# Patient Record
Sex: Female | Born: 1945 | Race: White | Hispanic: No | Marital: Married | Smoking: Never smoker
Health system: Southern US, Academic
[De-identification: ages and names within clinical notes are randomized; demographics above are authoritative.]

## PROBLEM LIST (undated history)

## (undated) DIAGNOSIS — M797 Fibromyalgia: Secondary | ICD-10-CM

## (undated) DIAGNOSIS — T148XXA Other injury of unspecified body region, initial encounter: Secondary | ICD-10-CM

## (undated) DIAGNOSIS — C801 Malignant (primary) neoplasm, unspecified: Secondary | ICD-10-CM

## (undated) DIAGNOSIS — E119 Type 2 diabetes mellitus without complications: Secondary | ICD-10-CM

## (undated) DIAGNOSIS — G4730 Sleep apnea, unspecified: Secondary | ICD-10-CM

## (undated) DIAGNOSIS — Z853 Personal history of malignant neoplasm of breast: Secondary | ICD-10-CM

## (undated) DIAGNOSIS — I10 Essential (primary) hypertension: Secondary | ICD-10-CM

## (undated) DIAGNOSIS — M1990 Unspecified osteoarthritis, unspecified site: Secondary | ICD-10-CM

## (undated) DIAGNOSIS — K219 Gastro-esophageal reflux disease without esophagitis: Secondary | ICD-10-CM

## (undated) DIAGNOSIS — C55 Malignant neoplasm of uterus, part unspecified: Secondary | ICD-10-CM

## (undated) DIAGNOSIS — H409 Unspecified glaucoma: Secondary | ICD-10-CM

## (undated) HISTORY — PX: COLONOSCOPY: SHX174

## (undated) HISTORY — PX: LYMPH NODE BIOPSY: SHX201

## (undated) HISTORY — DX: Malignant (primary) neoplasm, unspecified: C80.1

## (undated) HISTORY — PX: UTERINE FIBROID SURGERY: SHX826

## (undated) HISTORY — PX: HX BREAST LUMPECTOMY: SHX2

## (undated) HISTORY — DX: Malignant neoplasm of uterus, part unspecified: C55

## (undated) HISTORY — DX: Other injury of unspecified body region, initial encounter: T14.8XXA

## (undated) HISTORY — DX: Personal history of malignant neoplasm of breast: Z85.3

## (undated) HISTORY — PX: HX GALL BLADDER SURGERY/CHOLE: SHX55

## (undated) HISTORY — PX: EYE SURGERY: SHX253

## (undated) HISTORY — DX: Unspecified glaucoma: H40.9

## (undated) HISTORY — PX: HX HEART CATHETERIZATION: SHX148

## (undated) HISTORY — PX: NOSE SURGERY: SHX723

## (undated) HISTORY — DX: Sleep apnea, unspecified: G47.30

## (undated) HISTORY — PX: HX HYSTERECTOMY: SHX81

## (undated) HISTORY — PX: BILATERAL OOPHORECTOMY: SHX1221

## (undated) HISTORY — DX: Essential (primary) hypertension: I10

## (undated) HISTORY — PX: NERVE SURGERY: SHX1016

---

## 1992-10-07 ENCOUNTER — Other Ambulatory Visit (HOSPITAL_COMMUNITY): Payer: Self-pay

## 2008-06-16 DIAGNOSIS — R9439 Abnormal result of other cardiovascular function study: Secondary | ICD-10-CM | POA: Insufficient documentation

## 2013-02-17 DIAGNOSIS — L900 Lichen sclerosus et atrophicus: Secondary | ICD-10-CM | POA: Insufficient documentation

## 2013-02-18 DIAGNOSIS — C541 Malignant neoplasm of endometrium: Secondary | ICD-10-CM | POA: Insufficient documentation

## 2015-09-15 IMAGING — MG MAMMO DIGITAL SCREENING
1 series · 5 of 5 positions shown · non-contrast
Comparison: 

------------- REPORT [id number] -------------
MAMMO DIGITAL SCREENING WITH CAD

VOLKMAR GORAN,NP
Exam:  
Screening digital mammogram with CAD
INDICATION: Annual screening.

[R CC · oblique · right · 5 of 5 slices shown]
[im 1/5]
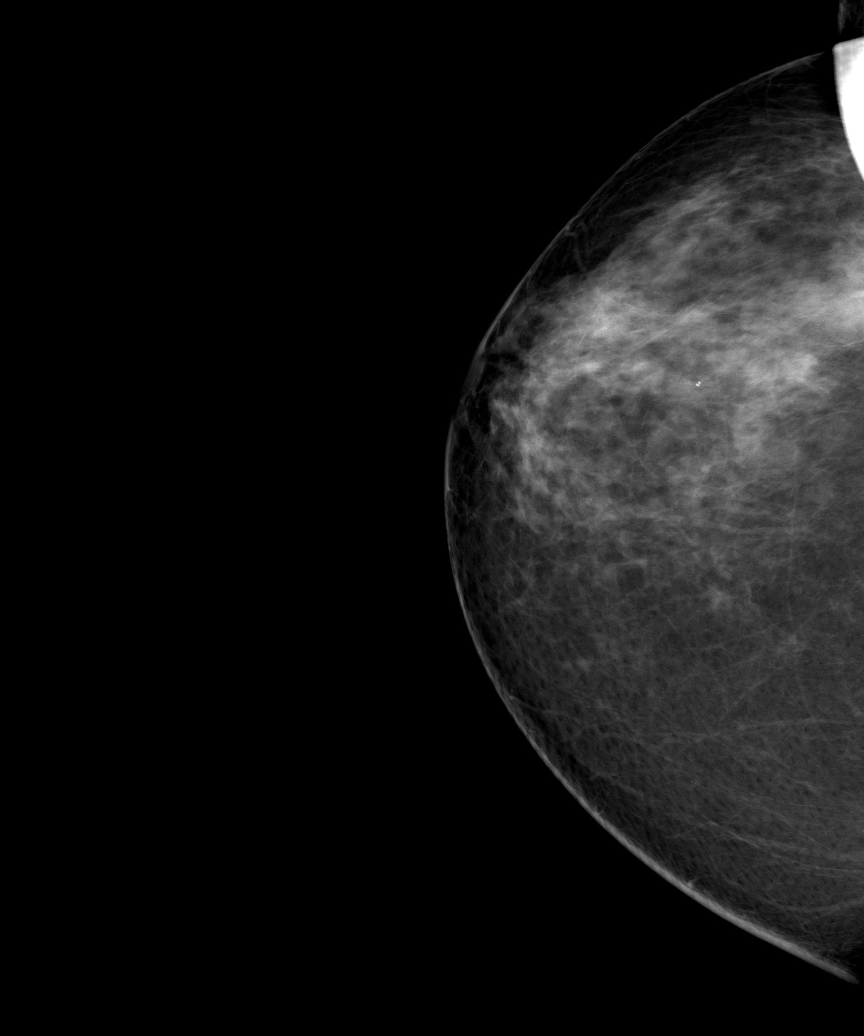
[im 2/5]
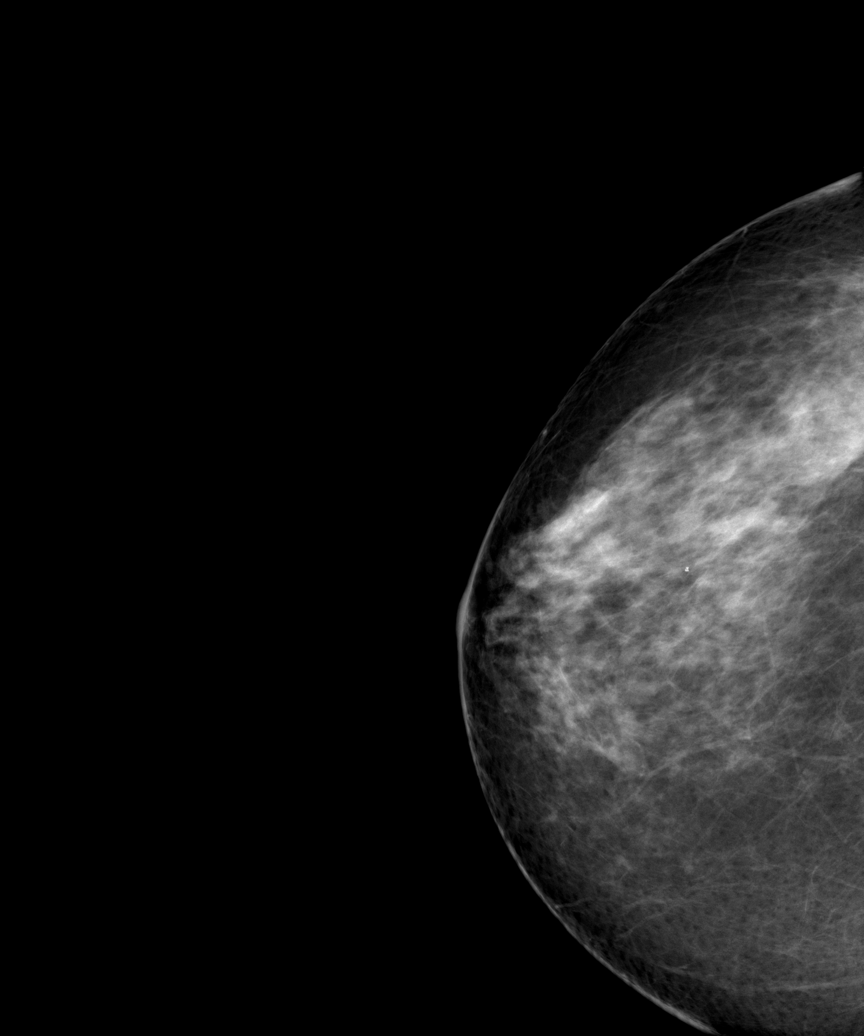
[im 3/5]
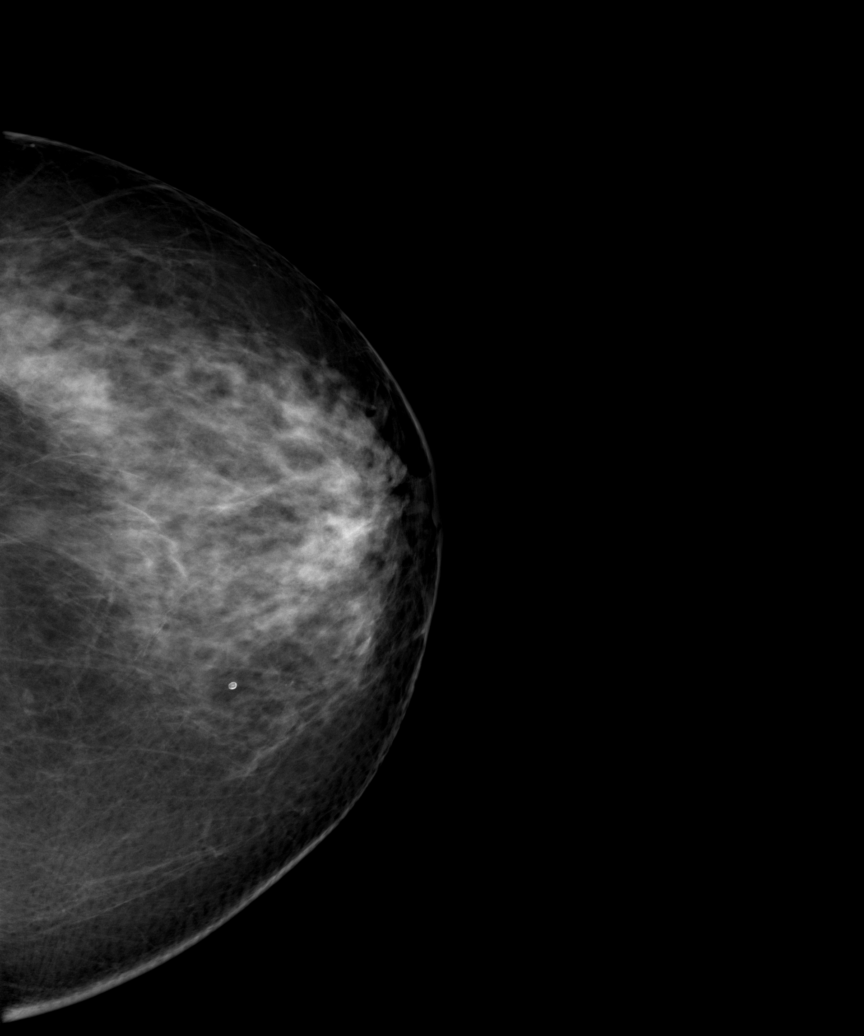
[im 4/5]
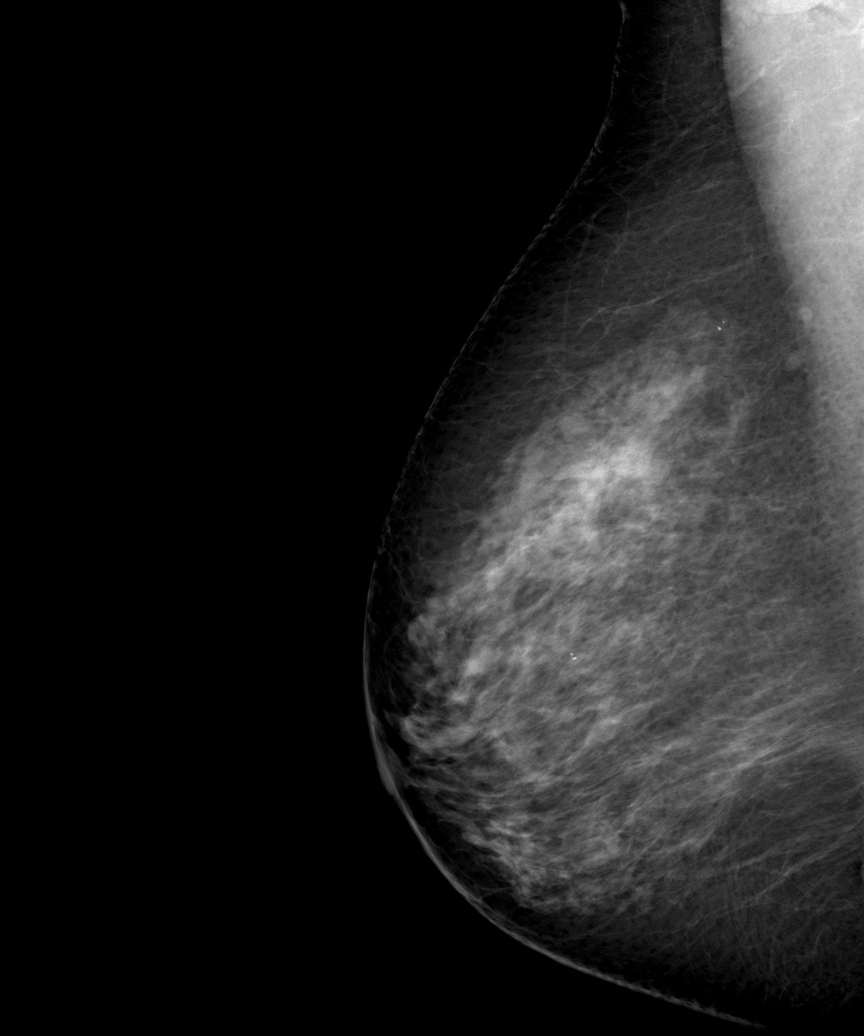
[im 5/5]
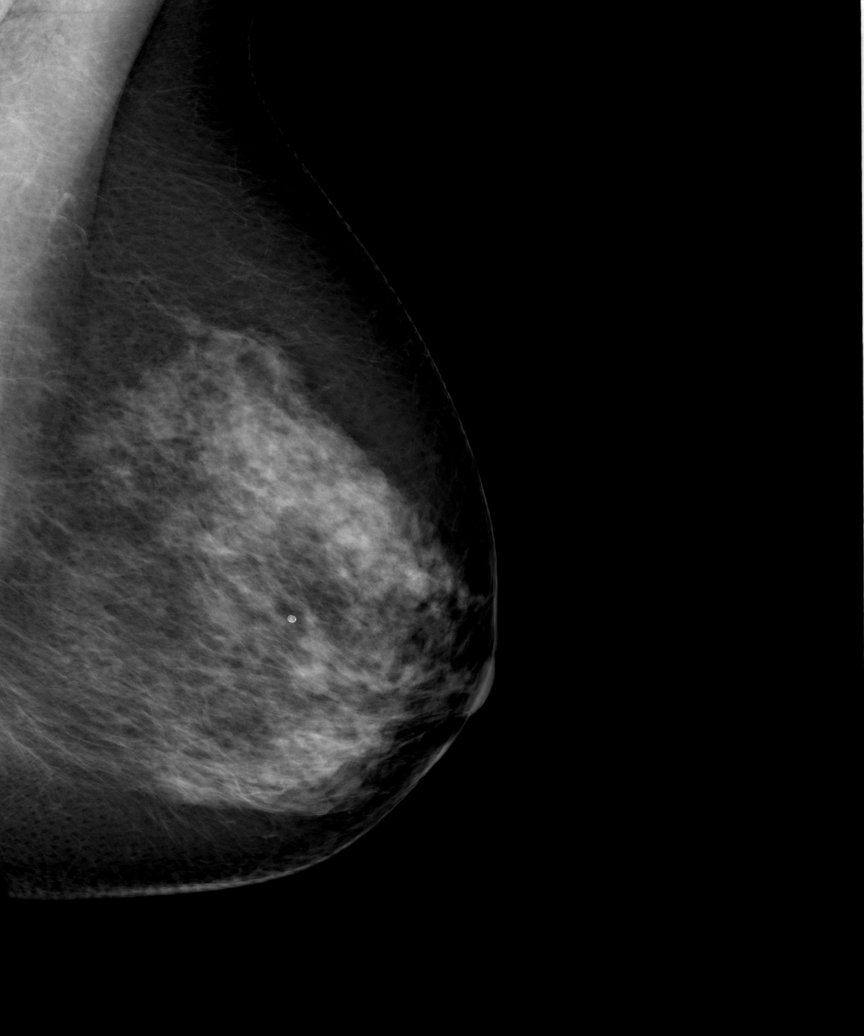

[5 of 5 positions shown; findings below may reference images not displayed]

FINDINGS: Breast parenchyma is heterogeneously dense. There is no mass or suspicious cluster of microcalcifications. There is no architectural distortion, skin thickening or nipple retraction.
IMPRESSION: 1.
BIRADS 2-Benign findings. Patient has been added in a reminder system with a
target date for the next screening mammography.
2.
DENSITY CODE   C (Heterogeneously dense)
Final Assessment Code:
Bi-Rads 2 

BI-RADS 0
Need additional imaging evaluation
BI-RADS 1
Negative mammogram
BI-RADS 2
Benign finding
BI-RADS 3
Probably benign finding: short-interval follow-up suggested
BI-RADS 4
Suspicious abnormality:  biopsy should be considered
BI-RADS 5
Highly suggestive of malignancy; appropriate action should be taken
BI-RADS 6
Known Biopsy-proven Malignancy  Appropriate action should be taken
NOTE:
In compliance with Federal regulations, the results of this mammogram are being sent to the patient.

------------- REPORT [id number] -------------
[facility]
[address]
We wish to report the following on your recent mammography examination. We are sending a report to your referring physician or other health care provider. 
(       Normal/Negative:
No evidence of cancer.
This statement is mandated by the [place], Department of Health.
Your examination was performed by one of our technologists, who are registered radiological technologists and also specially certified in mammography:
___
Frank, Zarina (M)
___
Huidrom, Rutik (M)

Your mammogram was interpreted by our radiologist.

( 
Jean Rousso Aniece, M.D.

(Annual Breast Examination by a physician or other health care provider
(Annual Mammography Screening beginning at age 40
(Monthly Breast Self Examination

## 2016-02-01 IMAGING — US ULTRASOUND BREAST
1 series · 13 of 25 positions shown · non-contrast
Comparison: Screening mammogram dated 09/15/2015.

ULTRASOUND BREAST RIGHT COMPLETE,ULTRASOUND BREAST LEFT COMPLETE 
Exam:  
Complete right breast ultrasound 

Exam:
Complete left breast ultrasound
INDICATION: Bilateral breast pain.

[Series 2: ultrasound breast · oblique · 0.10mm/px · 13 of 34 slices shown]
[im 1/34]
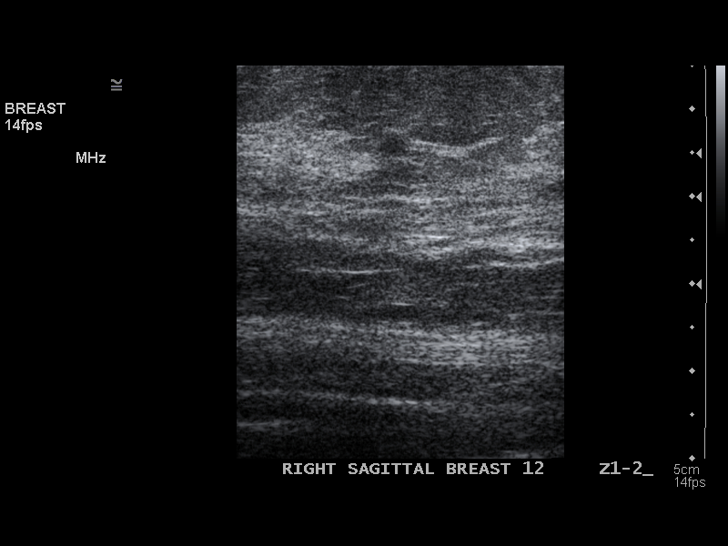
[im 3/34]
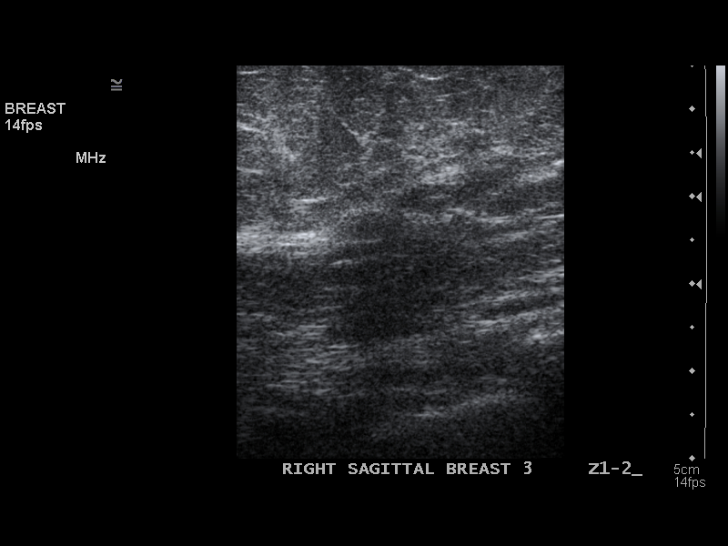
[im 6/34]
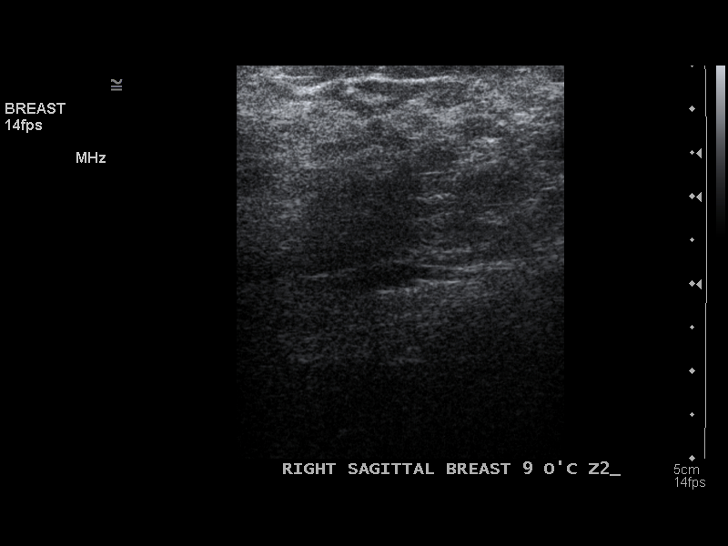
[im 9/34]
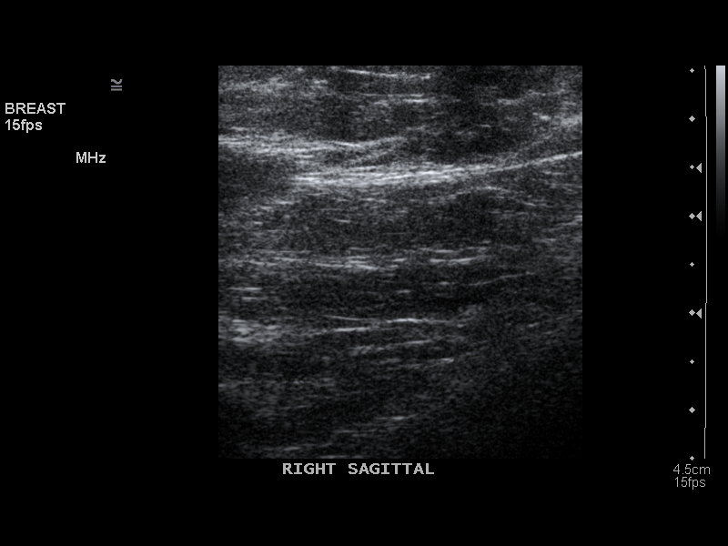
[im 12/34]
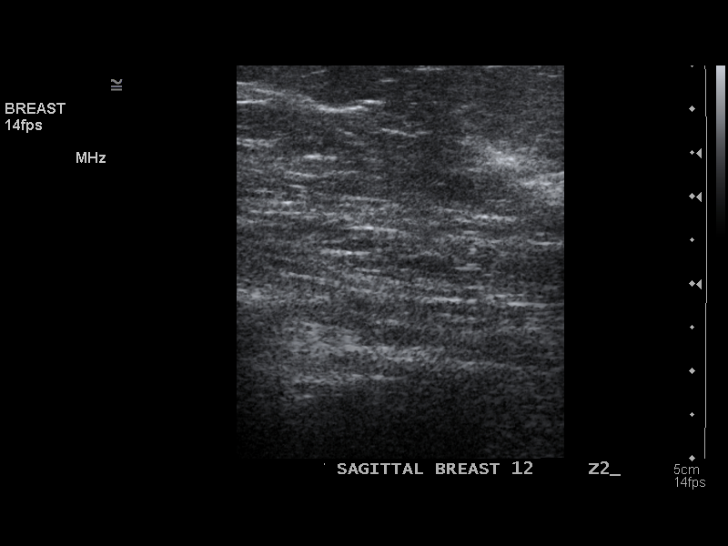
[im 14/34]
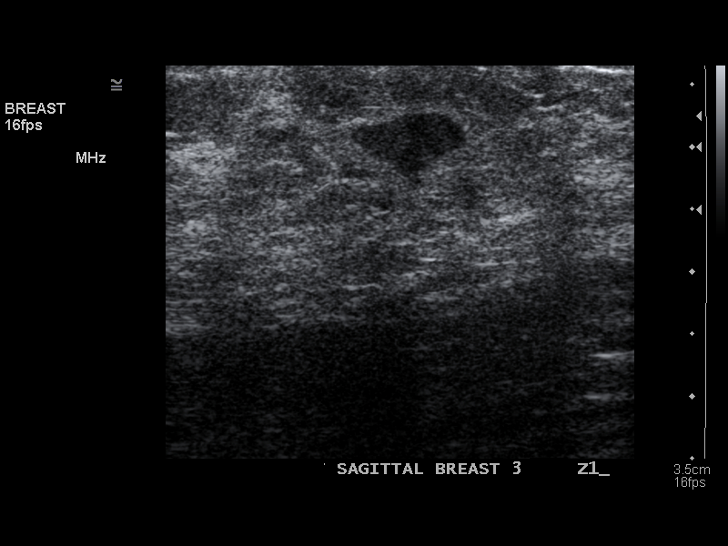
[im 17/34]
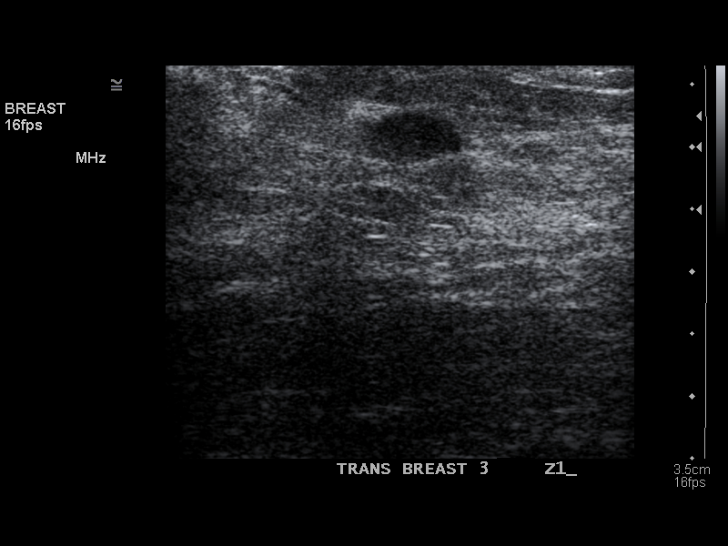
[im 20/34]
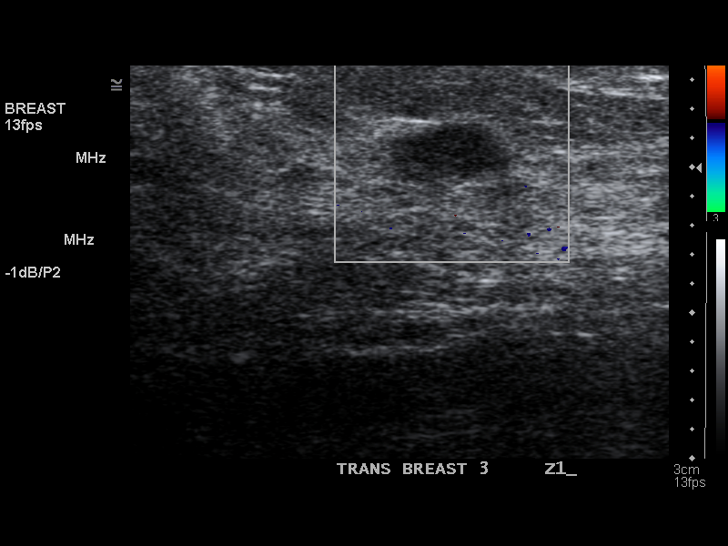
[im 23/34]
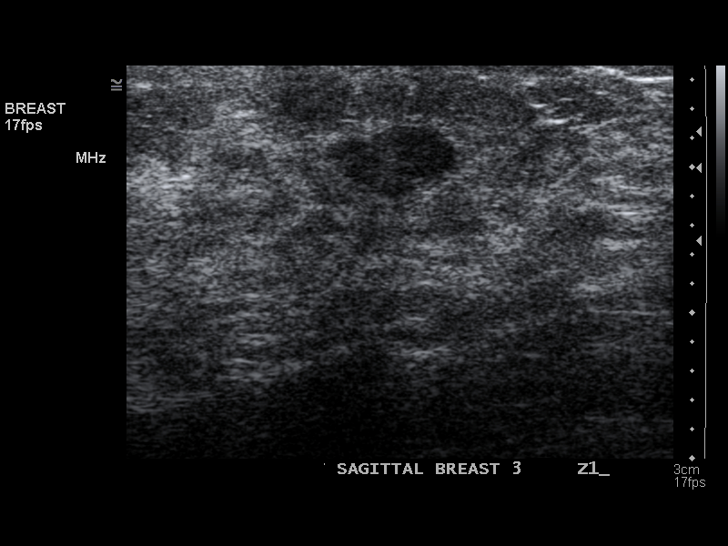
[im 25/34]
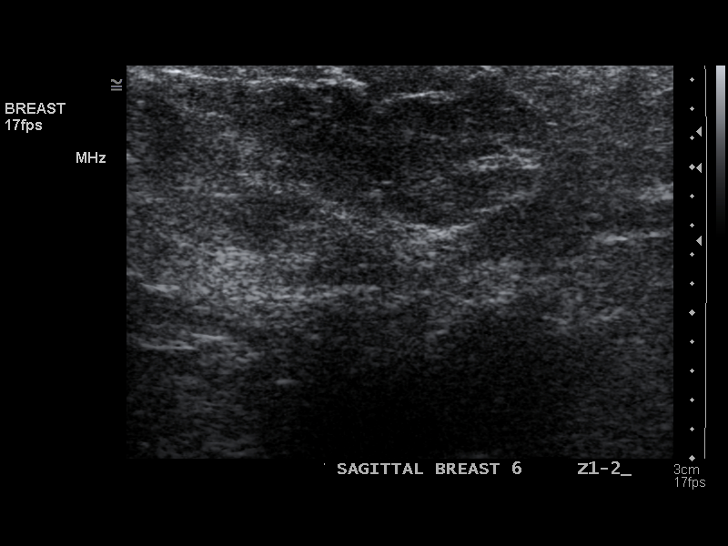
[im 28/34]
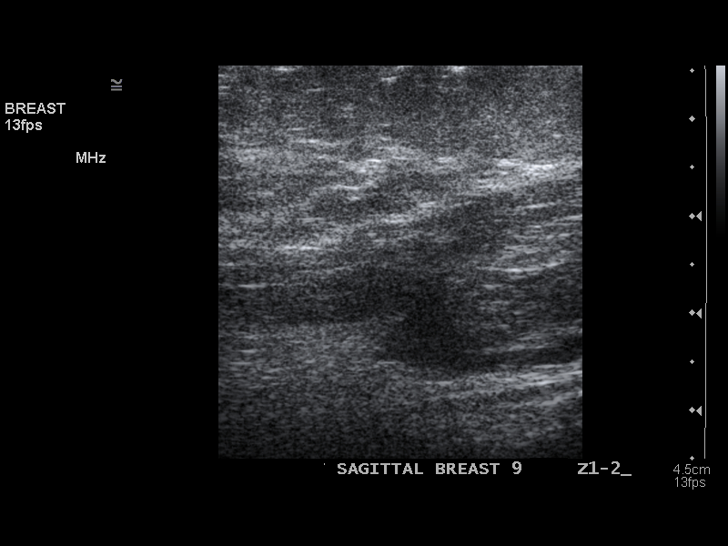
[im 31/34]
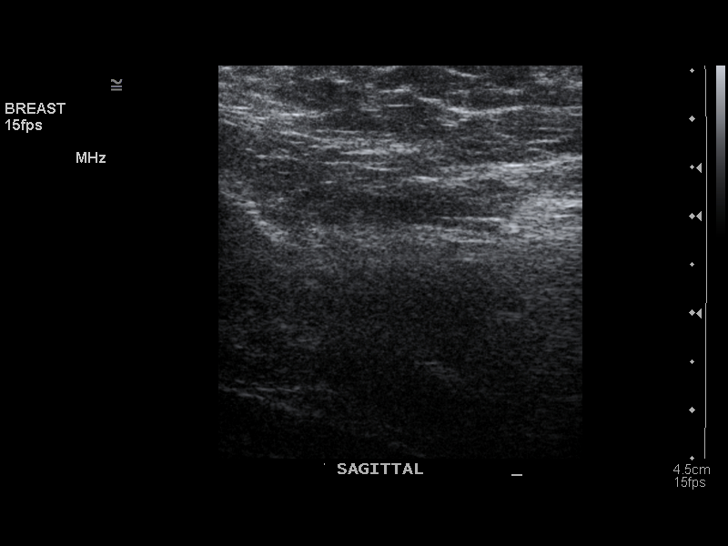
[im 34/34]
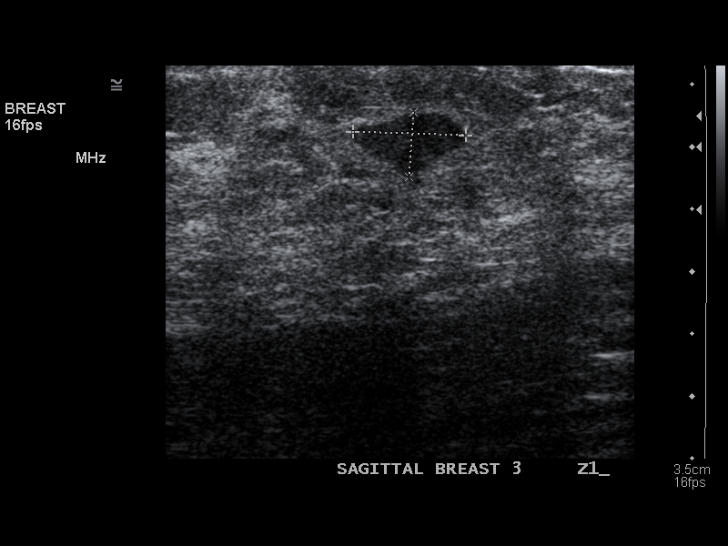

[13 of 25 positions shown; findings below may reference images not displayed]

FINDINGS: A comprehensive sonographic evaluation of both breasts was performed and represented images of all four quadrants, retroareolar and axillary regions were obtained. There is a 9 x 8 mm hypoechoic mass at 3 oclock position within the left breast with slightly indistinct posterior margin and some internal blood flow. No ductal dilatation, architectural distortion, skin thickening or axillary adenopathy is identified on either side.
IMPRESSION: 1.
BIRADS 6O-8ow probability of malignancy. A 9 x 8 mm hypoechoic mass at 3 oclock position within the left breast. Followup imaging guided biopsy is recommended to exclude neoplasm. Findings were discussed with the patient at the time of examination. 

Final Assessment Code:
Bi-Rads 4A

BI-RADS 0
Need additional imaging evaluation
BI-RADS 1
Negative mammogram
BI-RADS 2
Benign finding
BI-RADS 3
Probably benign finding: short-interval follow-up suggested
BI-RADS 4
Suspicious abnormality:  biopsy should be considered
BI-RADS 5
Highly suggestive of malignancy; appropriate action should be taken
BI-RADS 6
Known Biopsy-proven Malignancy  Appropriate action should be taken
NOTE:
In compliance with Federal regulations, the results of this mammogram are being sent to the patient.

## 2016-12-28 IMAGING — CR XRAY RIBS LT W/ PA CHEST
1 series · 4 of 4 positions shown · non-contrast
Comparison: 

Exam:   

Chest 2V
Exam:
Left ribs 3V
INDICATION: Cough.

[Series 2: view not recorded · 0.17mm/px · 4 of 4 slices shown]
[im 1/4]
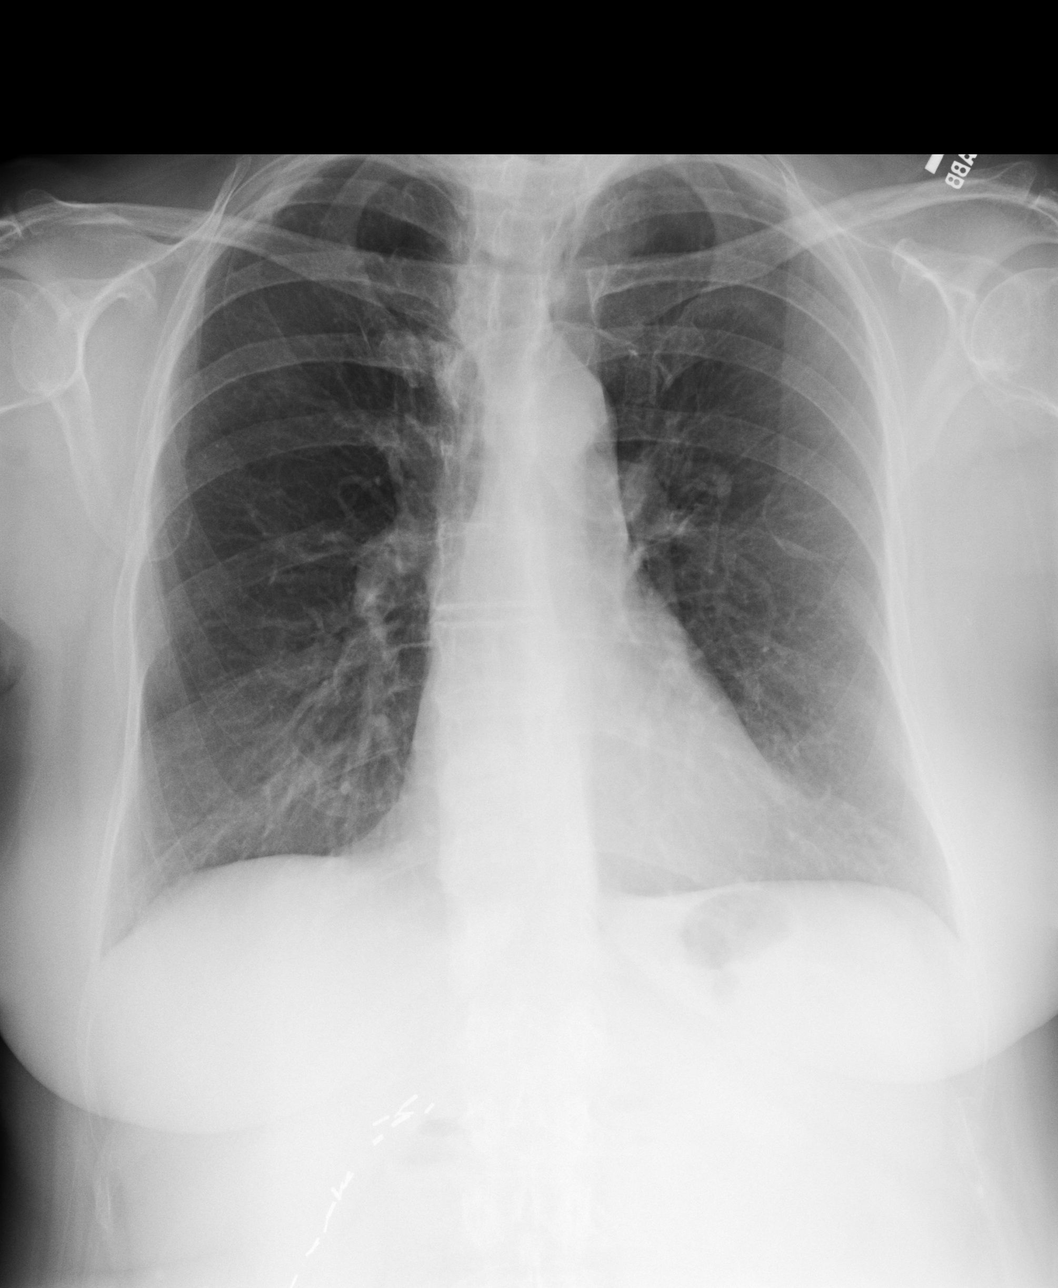
[im 2/4]
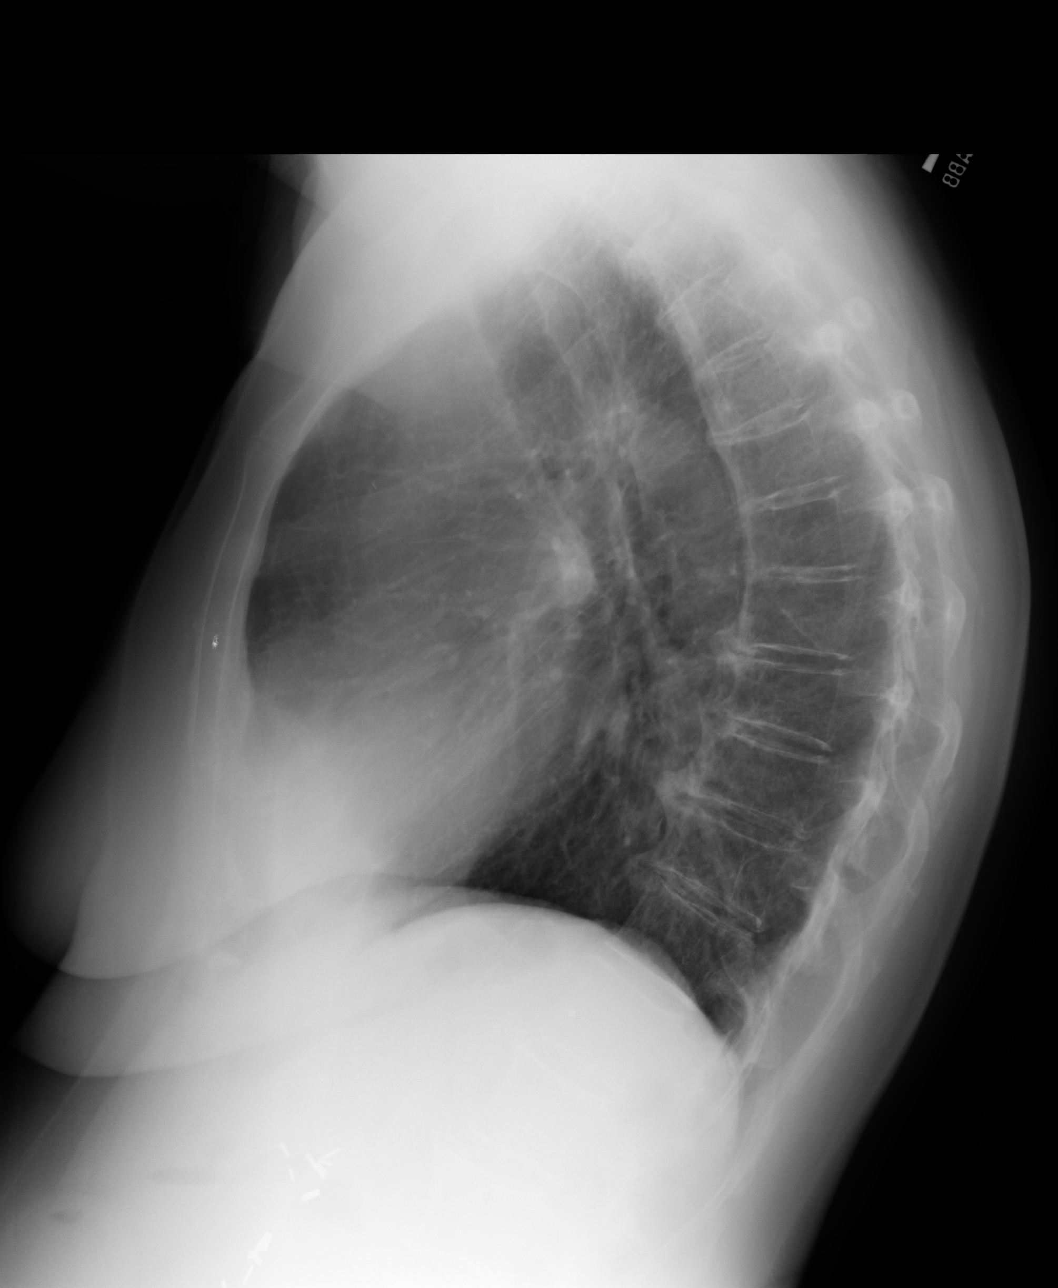
[im 3/4]
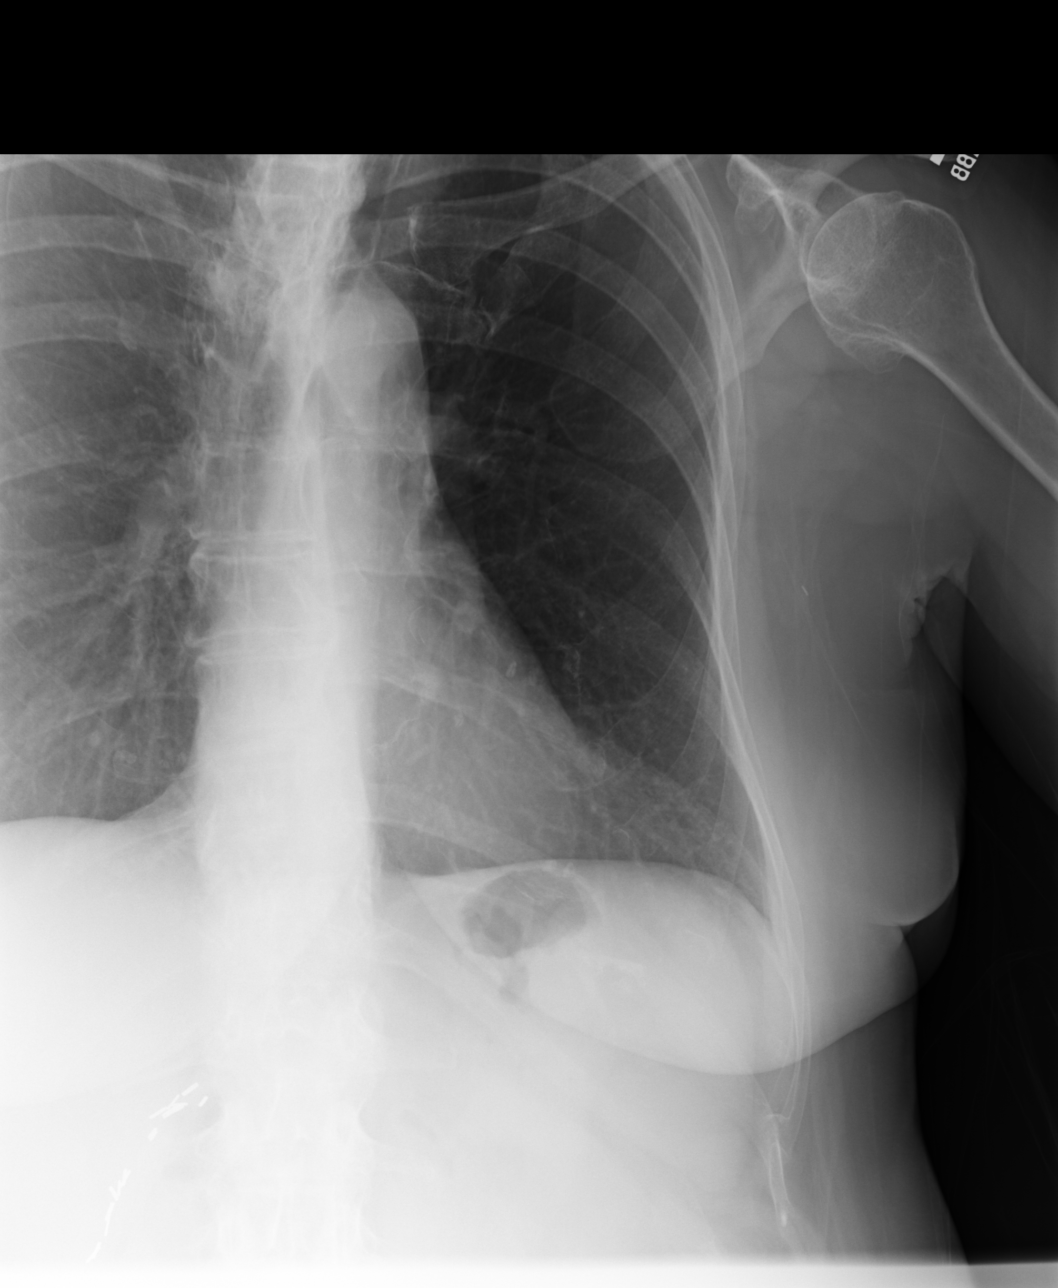
[im 4/4]
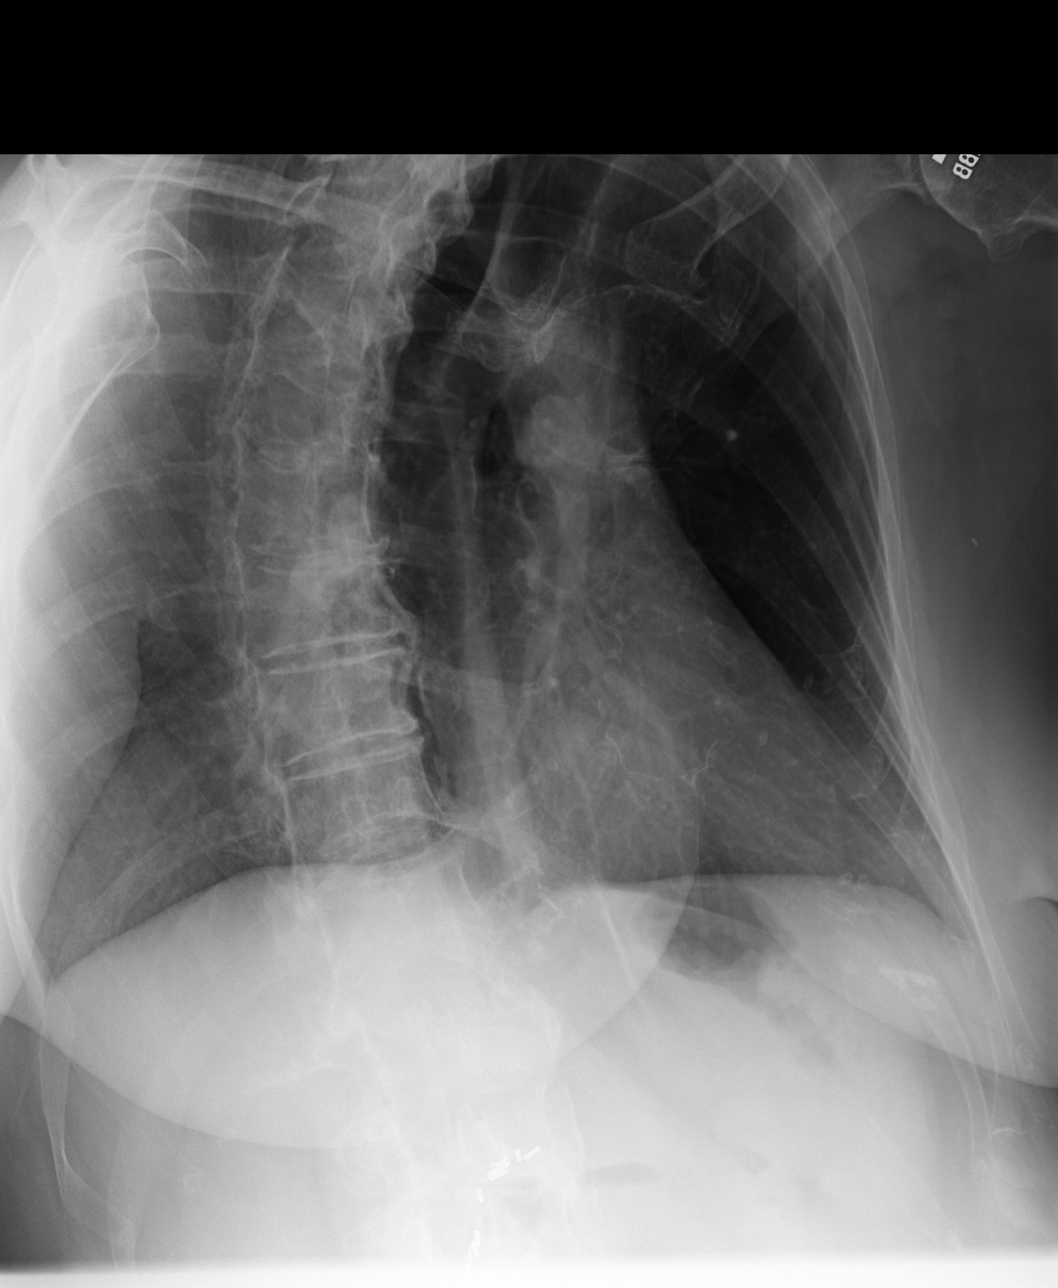

[4 of 4 positions shown; findings below may reference images not displayed]

FINDINGS: There is no consolidation or pleural effusion. There is no pneumothorax. Cardiomediastinal silhouette is normal. No displaced left-sided rib fracture is seen. Visualized upper abdomen demonstrates cholecystectomy clips. Mild arthritic changes are seen within the thoracic spine.
IMPRESSION: No acute abnormality.

## 2020-02-11 IMAGING — CR XRAY KNEE 3 VIEWS LT
1 series · 3 of 3 positions shown · non-contrast
Comparison: None available.

EXAM:  XRAY KNEE 3 VIEWS LT
INDICATION: Left knee pain.

[Series 1: view not recorded · 0.17mm/px · 3 of 3 slices shown]
[im 1/3]
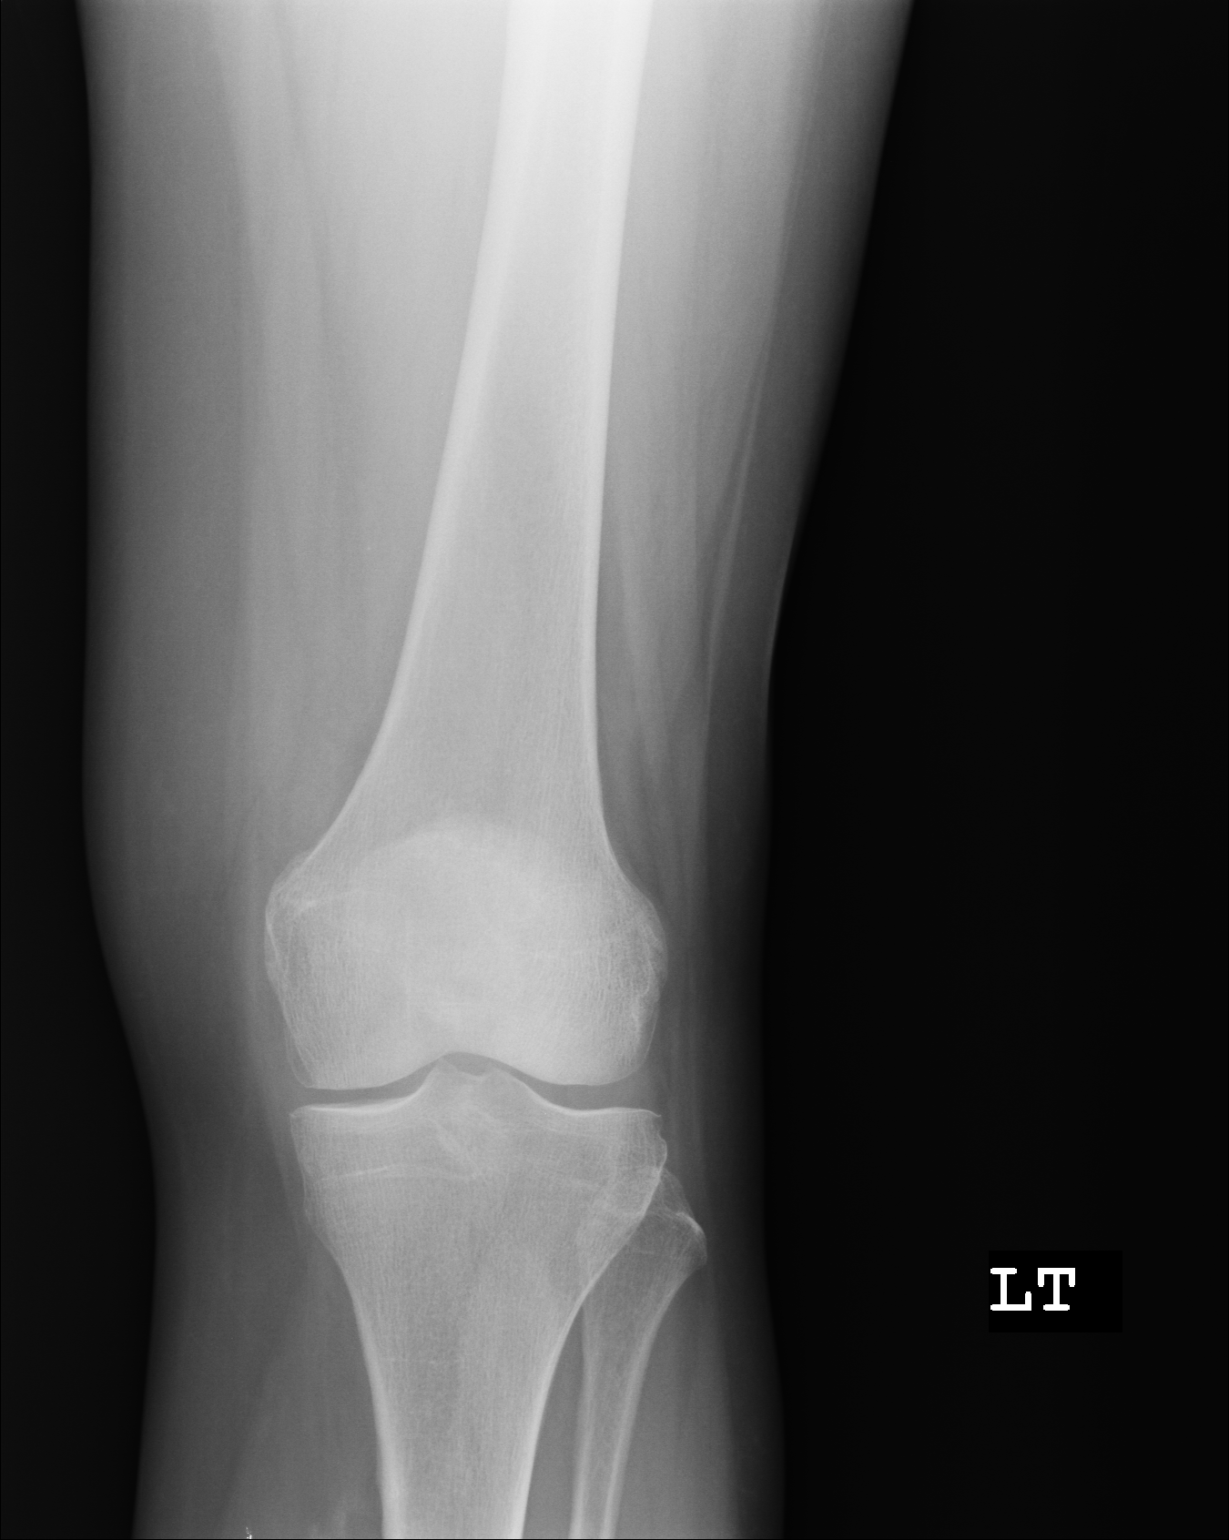
[im 2/3]
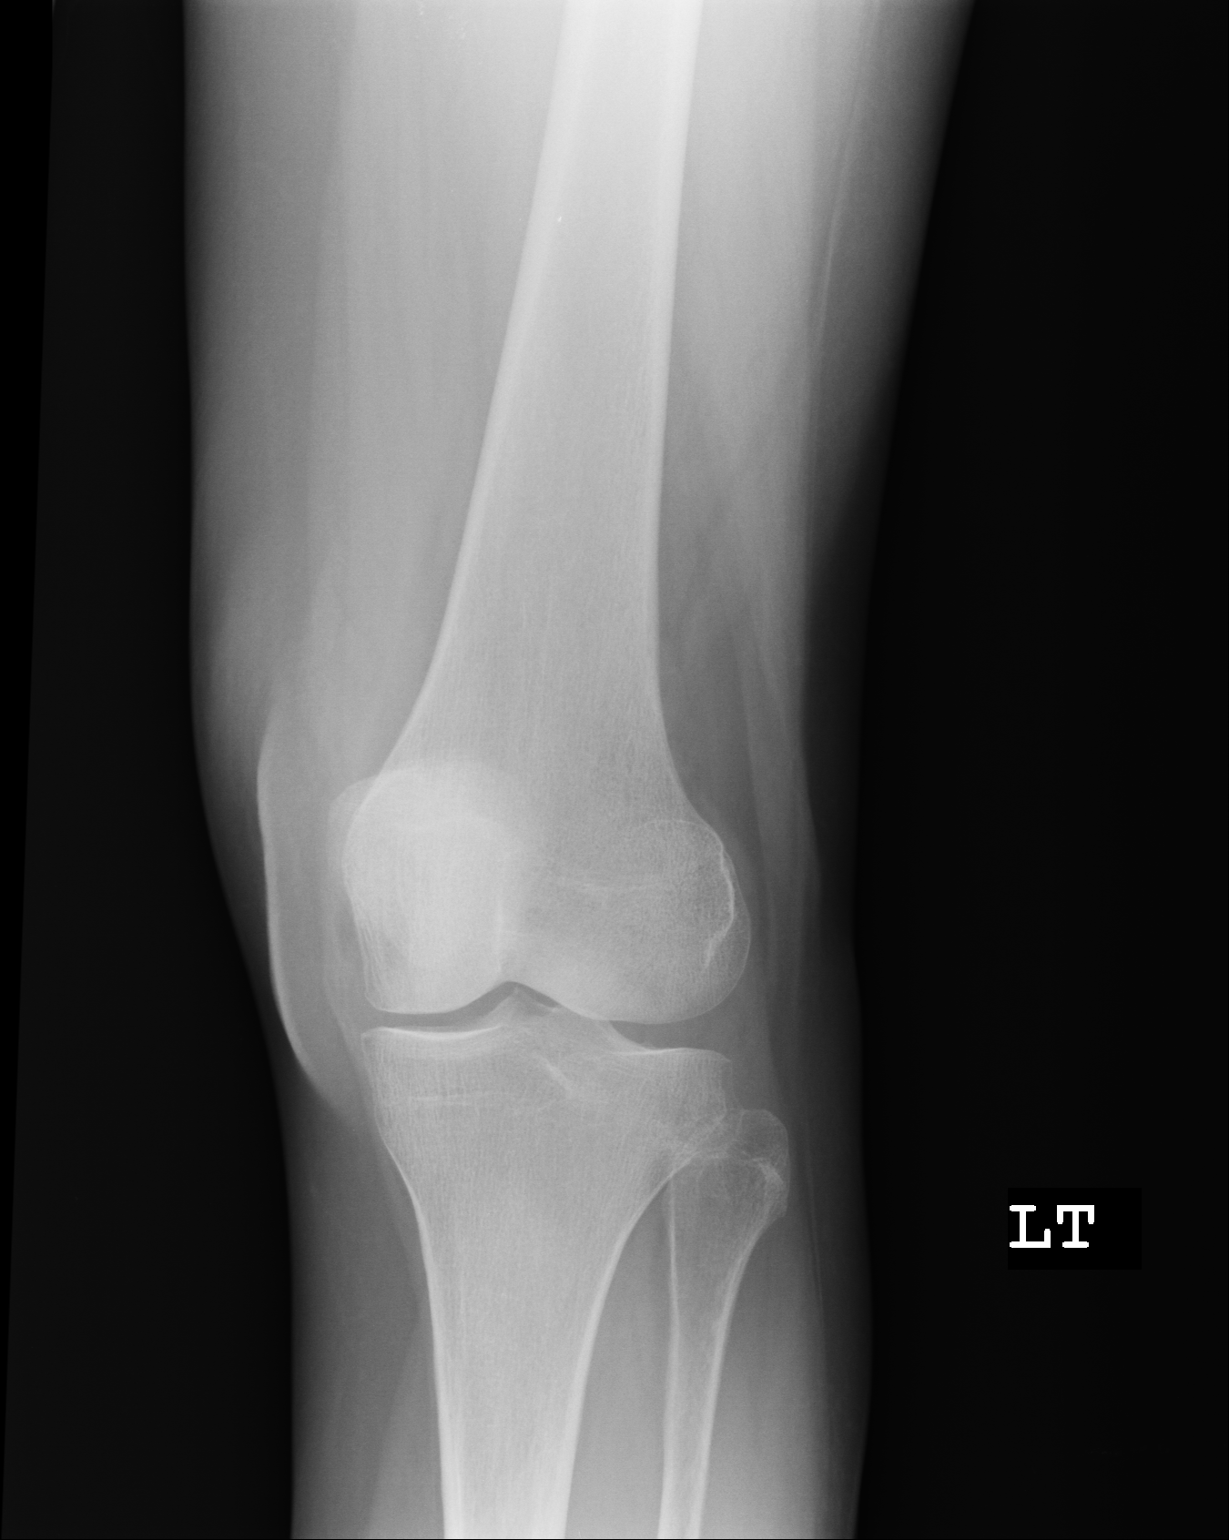
[im 3/3]
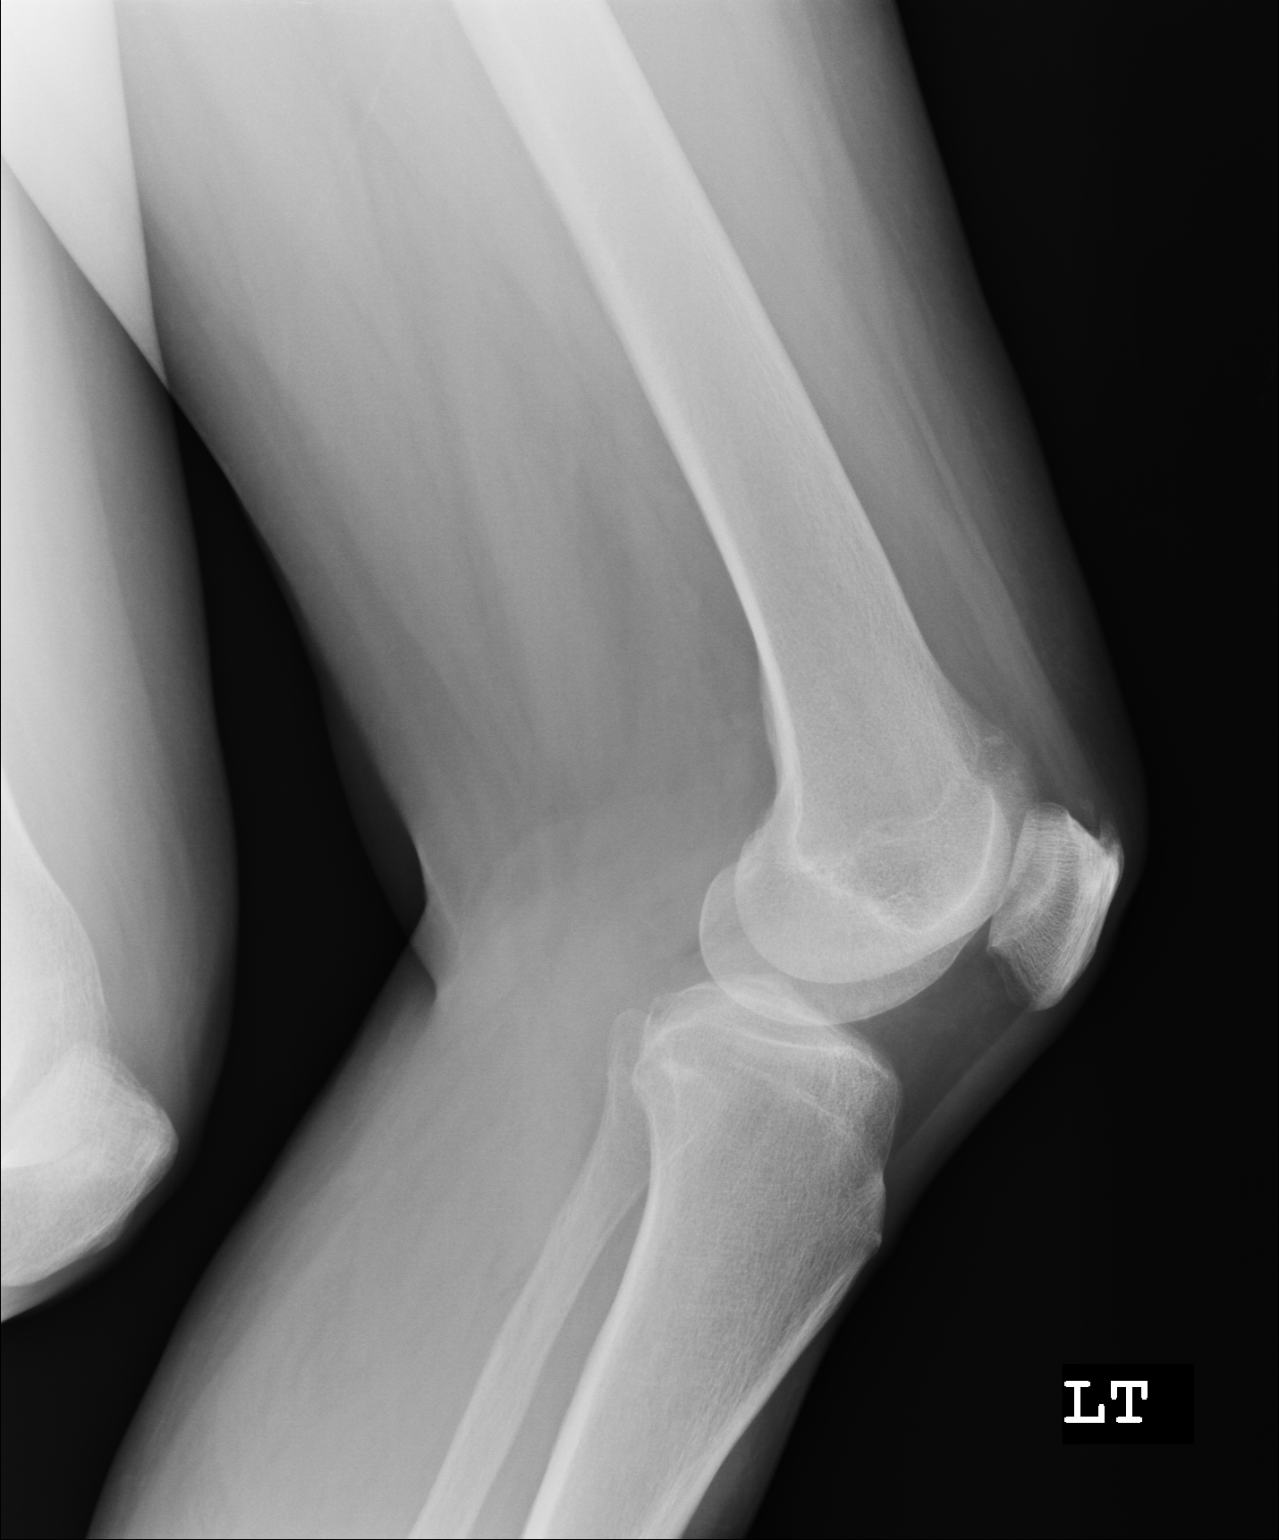

[3 of 3 positions shown; findings below may reference images not displayed]

FINDINGS: There is no acute fracture or subluxation. No significant arthritic changes are seen. There is no suprapatellar effusion. There is no soft tissue abnormality.
IMPRESSION: Unremarkable exam.

## 2020-02-11 IMAGING — CR XRAY HIP W/PELVIS BIL 2 VIEWS
1 series · 2 of 2 positions shown · non-contrast
Comparison: None available.

EXAM:  XRAY HIP W/PELVIS BIL 2 VIEWS
INDICATION: Left hip pain.

[Series 1: view not recorded · 0.17mm/px · 2 of 2 slices shown]
[im 1/2]
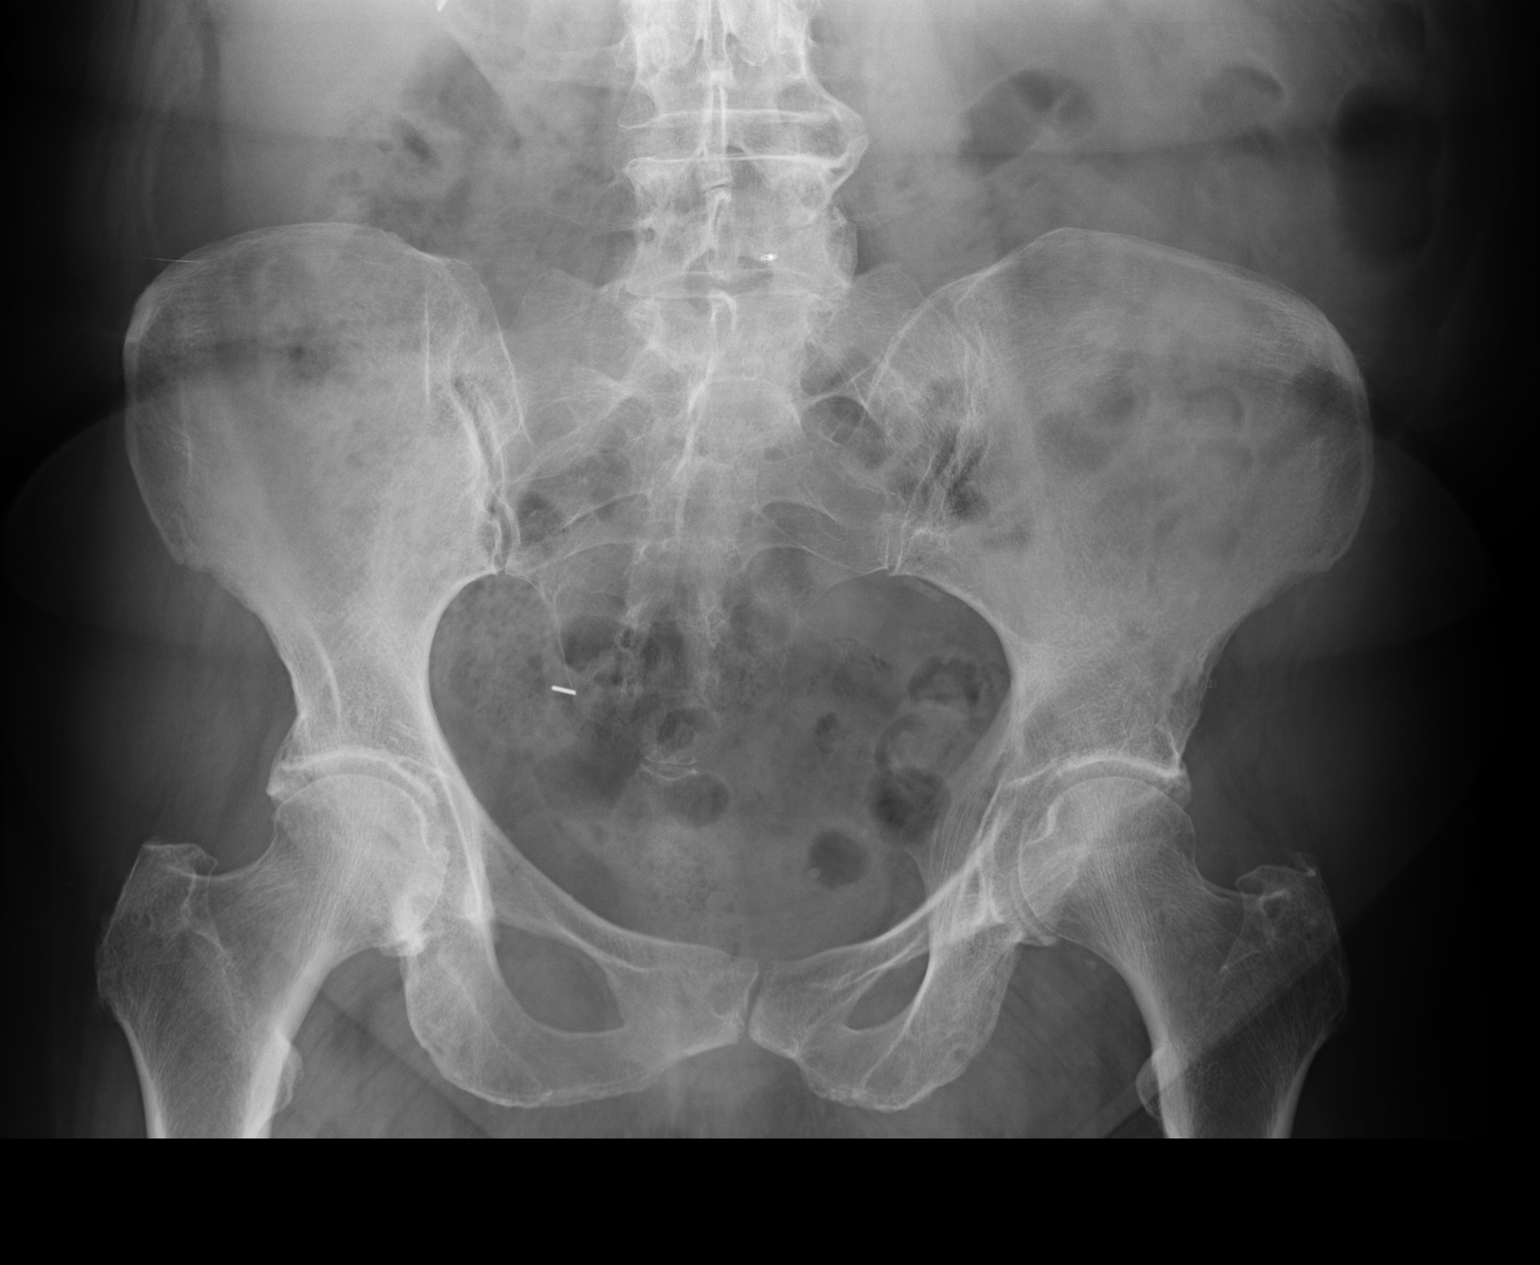
[im 2/2]
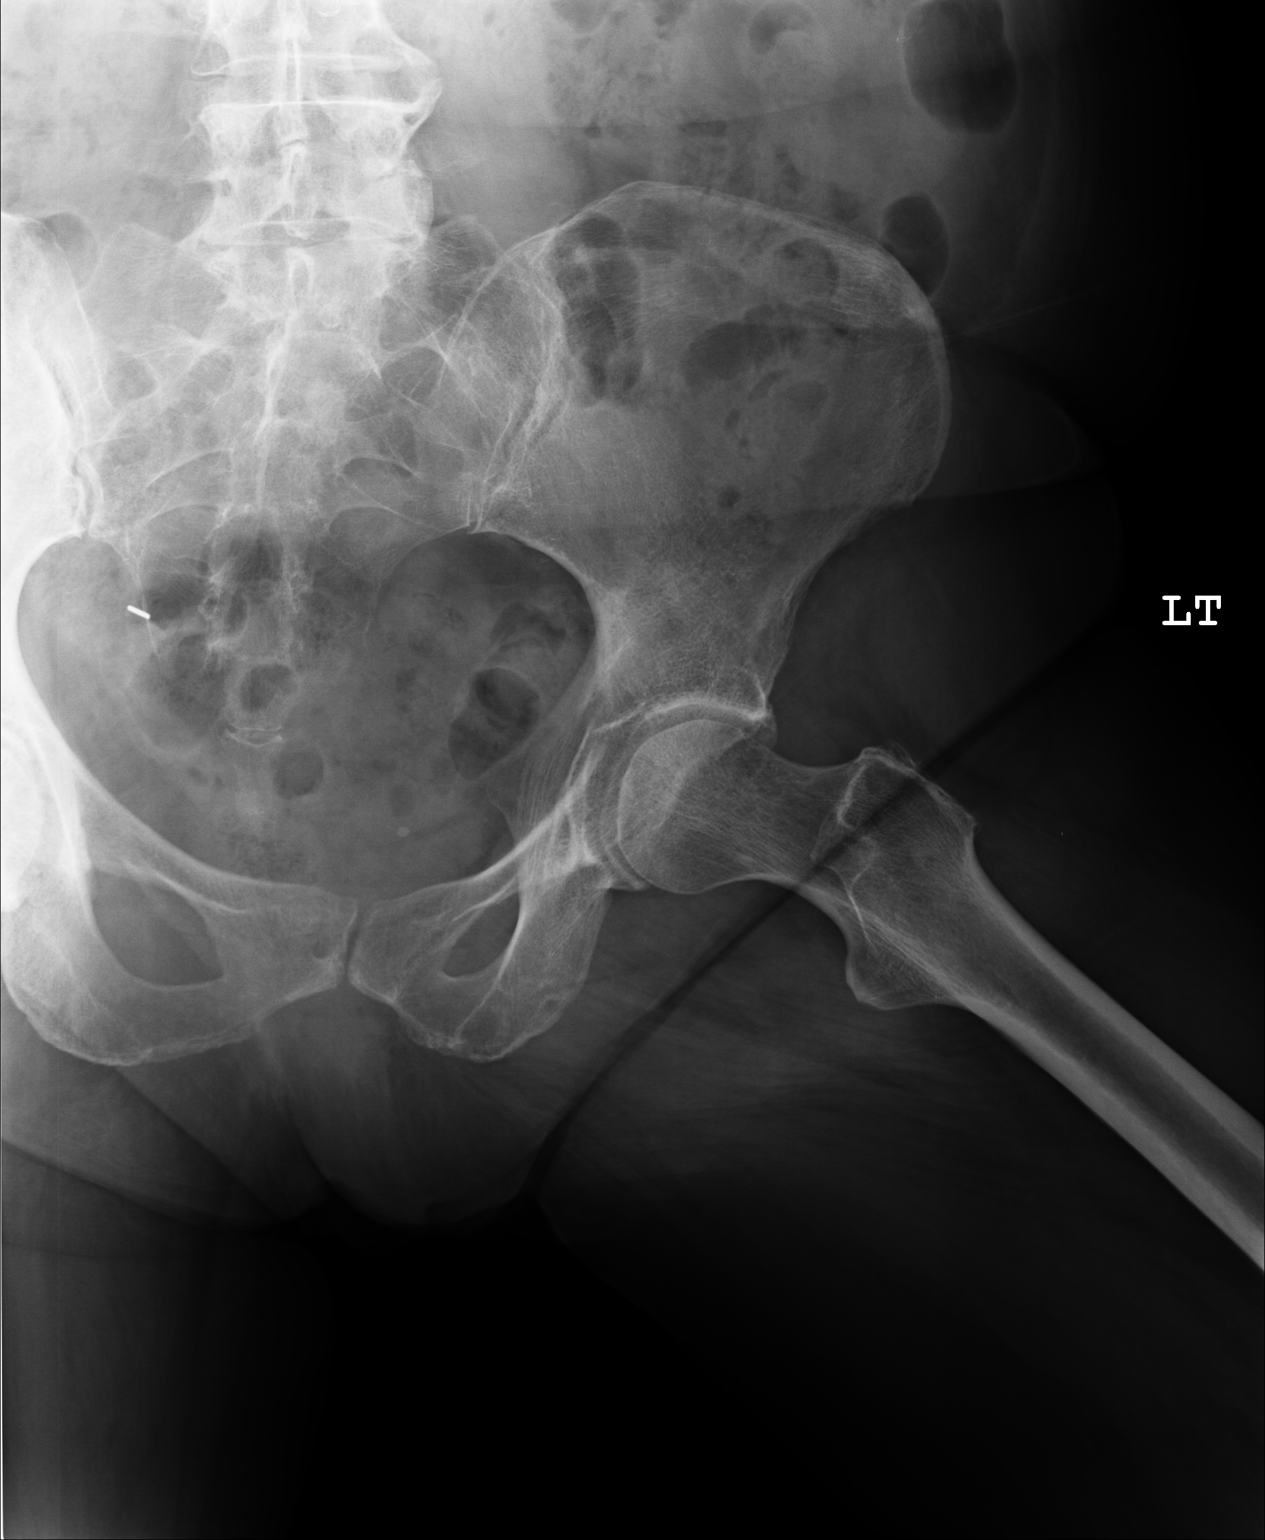

[2 of 2 positions shown; findings below may reference images not displayed]

FINDINGS: There is no acute fracture or subluxation. No significant arthritic changes are seen within the left hip. There is mild right hip osteoarthritis. Surrounding soft tissues are unremarkable.
IMPRESSION: Unremarkable left hip. Mild right hip osteoarthritis.

## 2020-02-11 IMAGING — MR MRI BRAIN WITHOUT AND WITH CONTRAST
10 of 12 series · 35 of 48 positions shown · IV contrast (gadavist)
Comparison: None available.

EXAM:  MRI BRAIN WITHOUT AND WITH CONTRAST
INDICATION: Trigeminal neuralgia.
TECHNIQUE: Multiplanar multisequential MRI of the brain and internal auditory canals was performed without and with 5 mL of Gadavist.

[Series 12: DWI · axial · 5.0mm · 1.35mm/px · z∈[-66,+60]mm · 9 of 88 slices shown (1 of 3)]
[im 1/88]
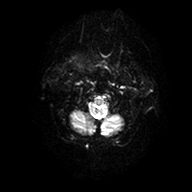
[im 16/88]
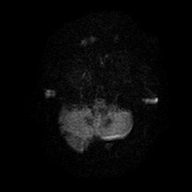
[im 24/88]
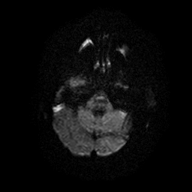
[im 40/88]
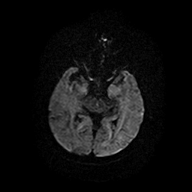
[im 48/88]
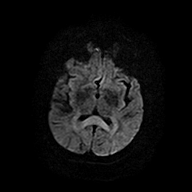
[im 64/88]
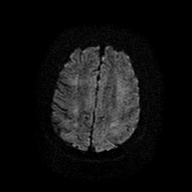
[im 72/88]
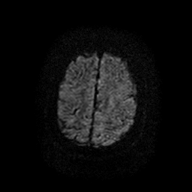
[im 80/88]
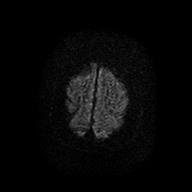
[im 88/88]
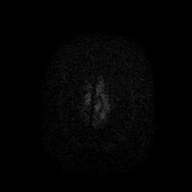

[Series 13: DWI · axial · 5.0mm · 1.35mm/px · z∈[-66,+60]mm · 4 of 22 slices shown (2 of 3)]
[im 1/22]
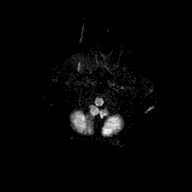
[im 8/22]
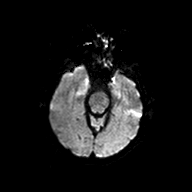
[im 15/22]
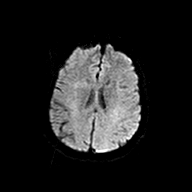
[im 22/22]
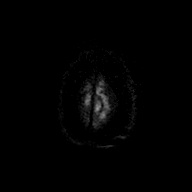

[Series 14: DWI · axial · 5.0mm · 1.35mm/px · z∈[-66,+60]mm · 3 of 22 slices shown (3 of 3)]
[im 1/22]
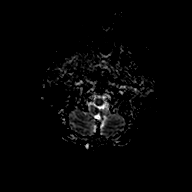
[im 11/22]
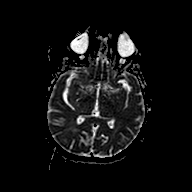
[im 22/22]
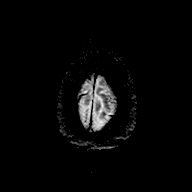

[Series 15: FLAIR · sagittal · 4.0mm · 0.75mm/px · 3 of 26 slices shown (1 of 2)]
[im 1/26]
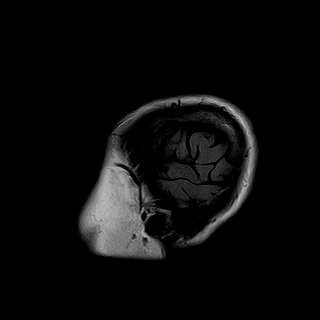
[im 13/26]
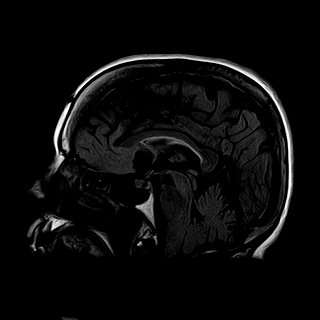
[im 26/26]
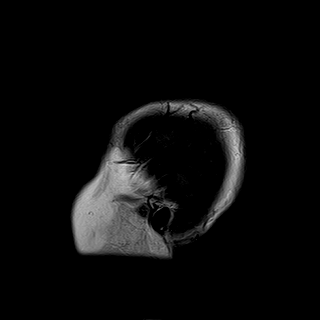

[Series 16: T2 · axial · 4.0mm · 0.43mm/px · z∈[-63,+93]mm · 4 of 36 slices shown]
[im 1/36]
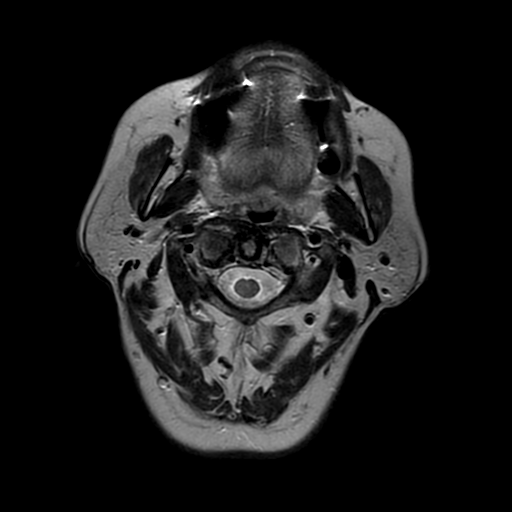
[im 12/36]
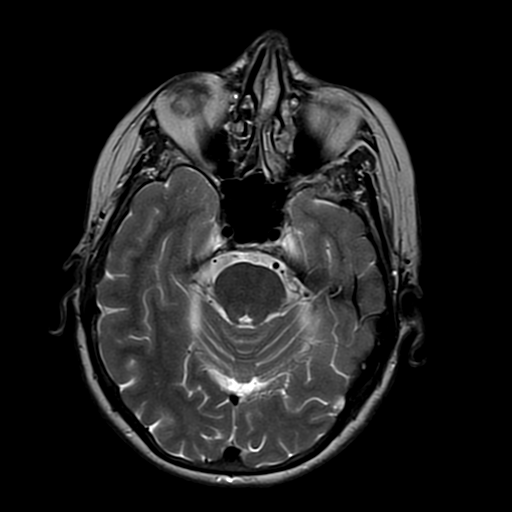
[im 24/36]
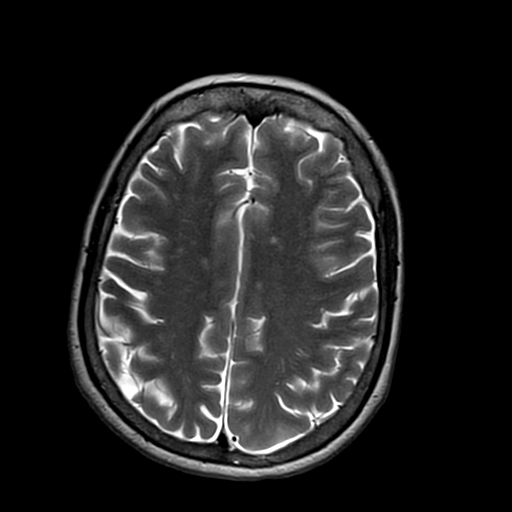
[im 36/36]
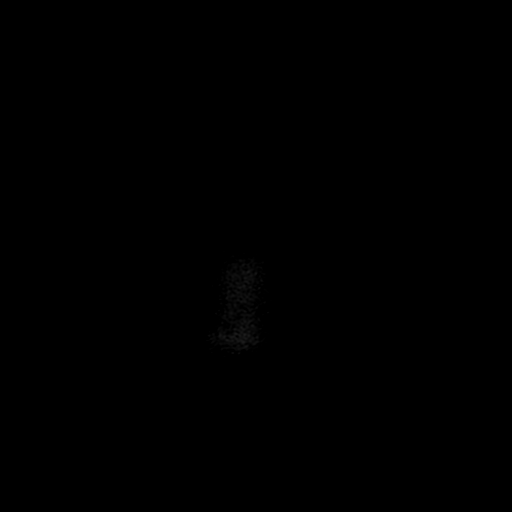

[Series 17: FLAIR · axial · 4.0mm · 0.43mm/px · z∈[-63,+93]mm · 4 of 36 slices shown (2 of 2)]
[im 1/36]
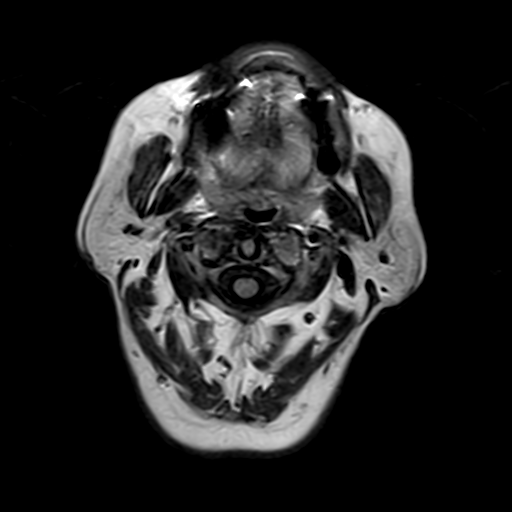
[im 12/36]
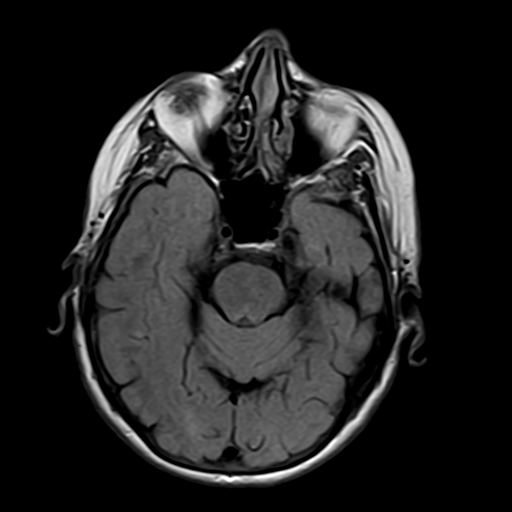
[im 24/36]
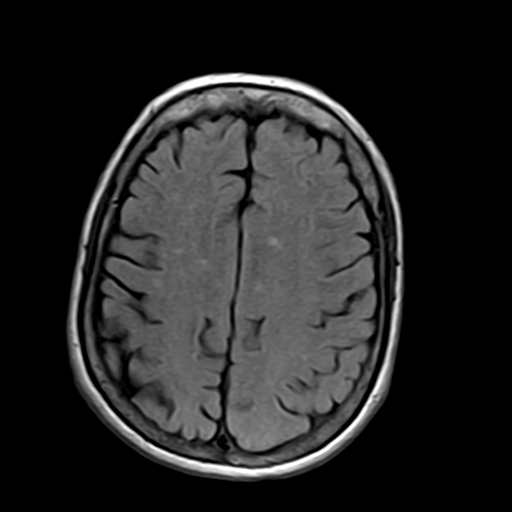
[im 36/36]
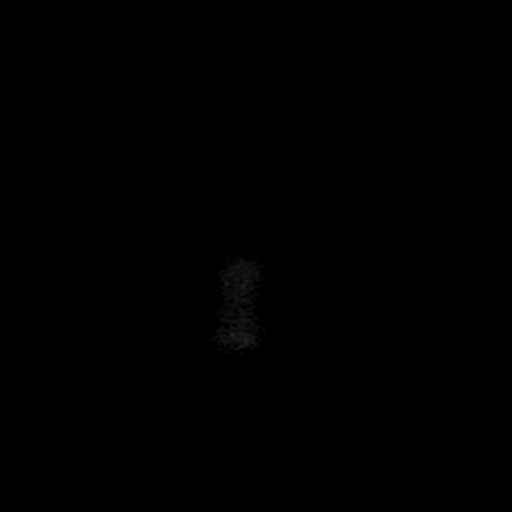

[Series 18: T1 · axial · 4.0mm · 0.43mm/px · z∈[-63,-14]mm · 2 of 36 slices shown]
[im 1/36]
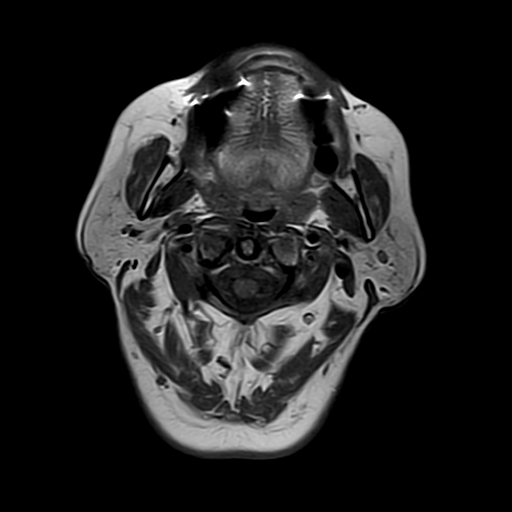
[im 12/36]
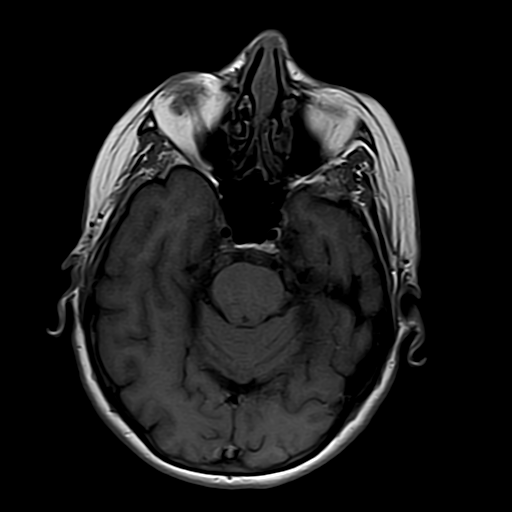

[Series 21: T1 post-contrast · axial · 3.0mm · 0.47mm/px · 1 of 11 slices shown (1 of 3)]
[im 1/11]
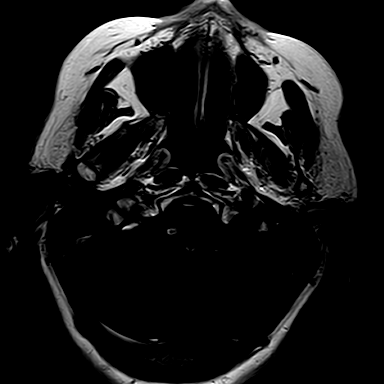

[Series 22: T1 post-contrast · axial · 4.0mm · 0.43mm/px · z∈[-63,+93]mm · 4 of 36 slices shown (2 of 3)]
[im 1/36]
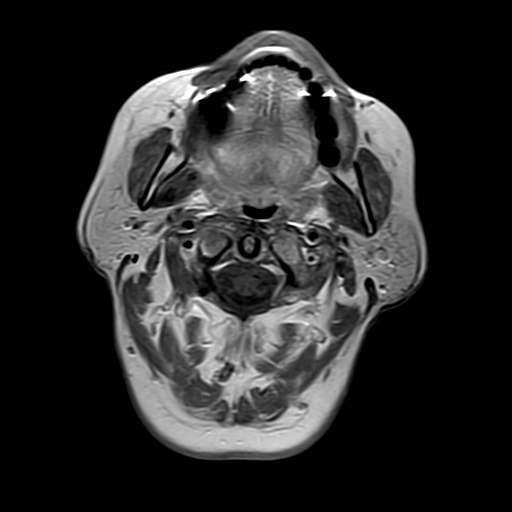
[im 12/36]
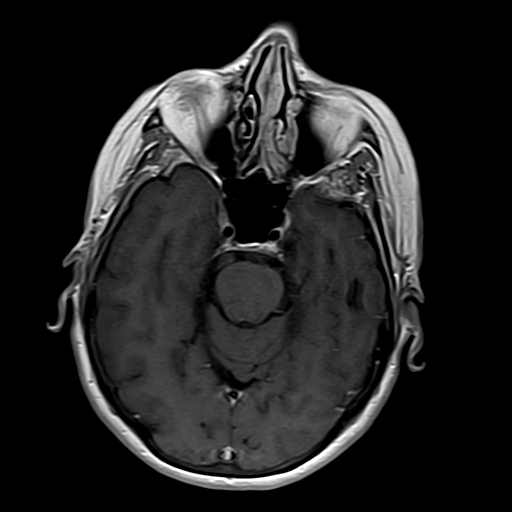
[im 24/36]
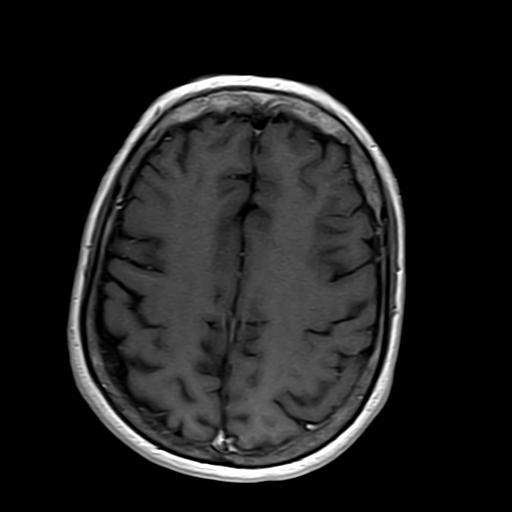
[im 36/36]
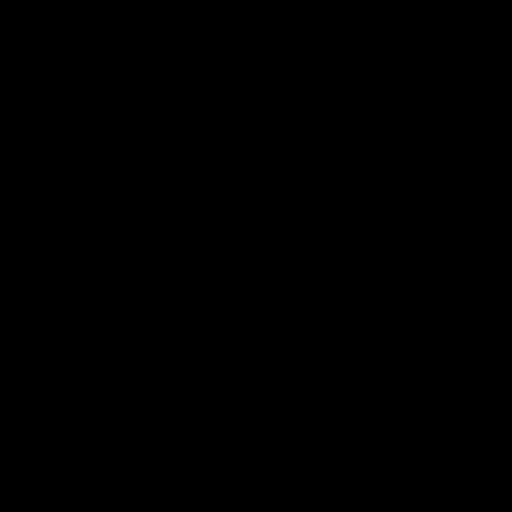

[Series 23: T1 post-contrast · coronal · 3.0mm · 0.47mm/px · 1 of 11 slices shown (3 of 3)]
[im 1/11]
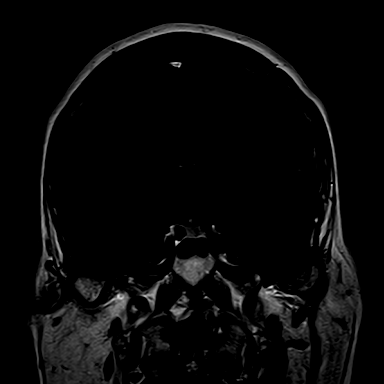

[35 of 48 positions shown; findings below may reference images not displayed]

FINDINGS: Ventricular and sulcal size is normal for the patient's age. There are mild chronic small vessel ischemic changes. There is no mass effect, midline shift or intracranial hemorrhage. There is no evidence of acute infarction or prior microhemorrhages. Skull base flow voids and basal cisterns are patent. There is no cerebellopontine angle mass. Normal T2 signal intensity is noted within the cochlea, vestibule and semicircular canals bilaterally. Sagittal survey of midline structures is unremarkable. 

Following intravenous contrast administration, there is no abnormal parenchymal or leptomeningeal enhancement. No abnormal enhancement of the trigeminal nerves is noted. There are no extra-axial fluid collections. Visualized paranasal sinuses, mastoid air cells and orbital contents are unremarkable.
IMPRESSION: 1. Mild chronic small vessel ischemic changes, no acute intracranial abnormality. 

 2. No abnormal parenchymal or leptomeningeal enhancement. 

3. Unremarkable trigeminal nerves.

## 2020-02-11 IMAGING — CR XRAY KNEE 3 VIEWS RT
1 series · 3 of 3 positions shown · non-contrast
Comparison: None available.

EXAM:  XRAY KNEE 3 VIEWS RT
INDICATION: Right knee pain.

[Series 1: view not recorded · 0.17mm/px · 3 of 3 slices shown]
[im 1/3]
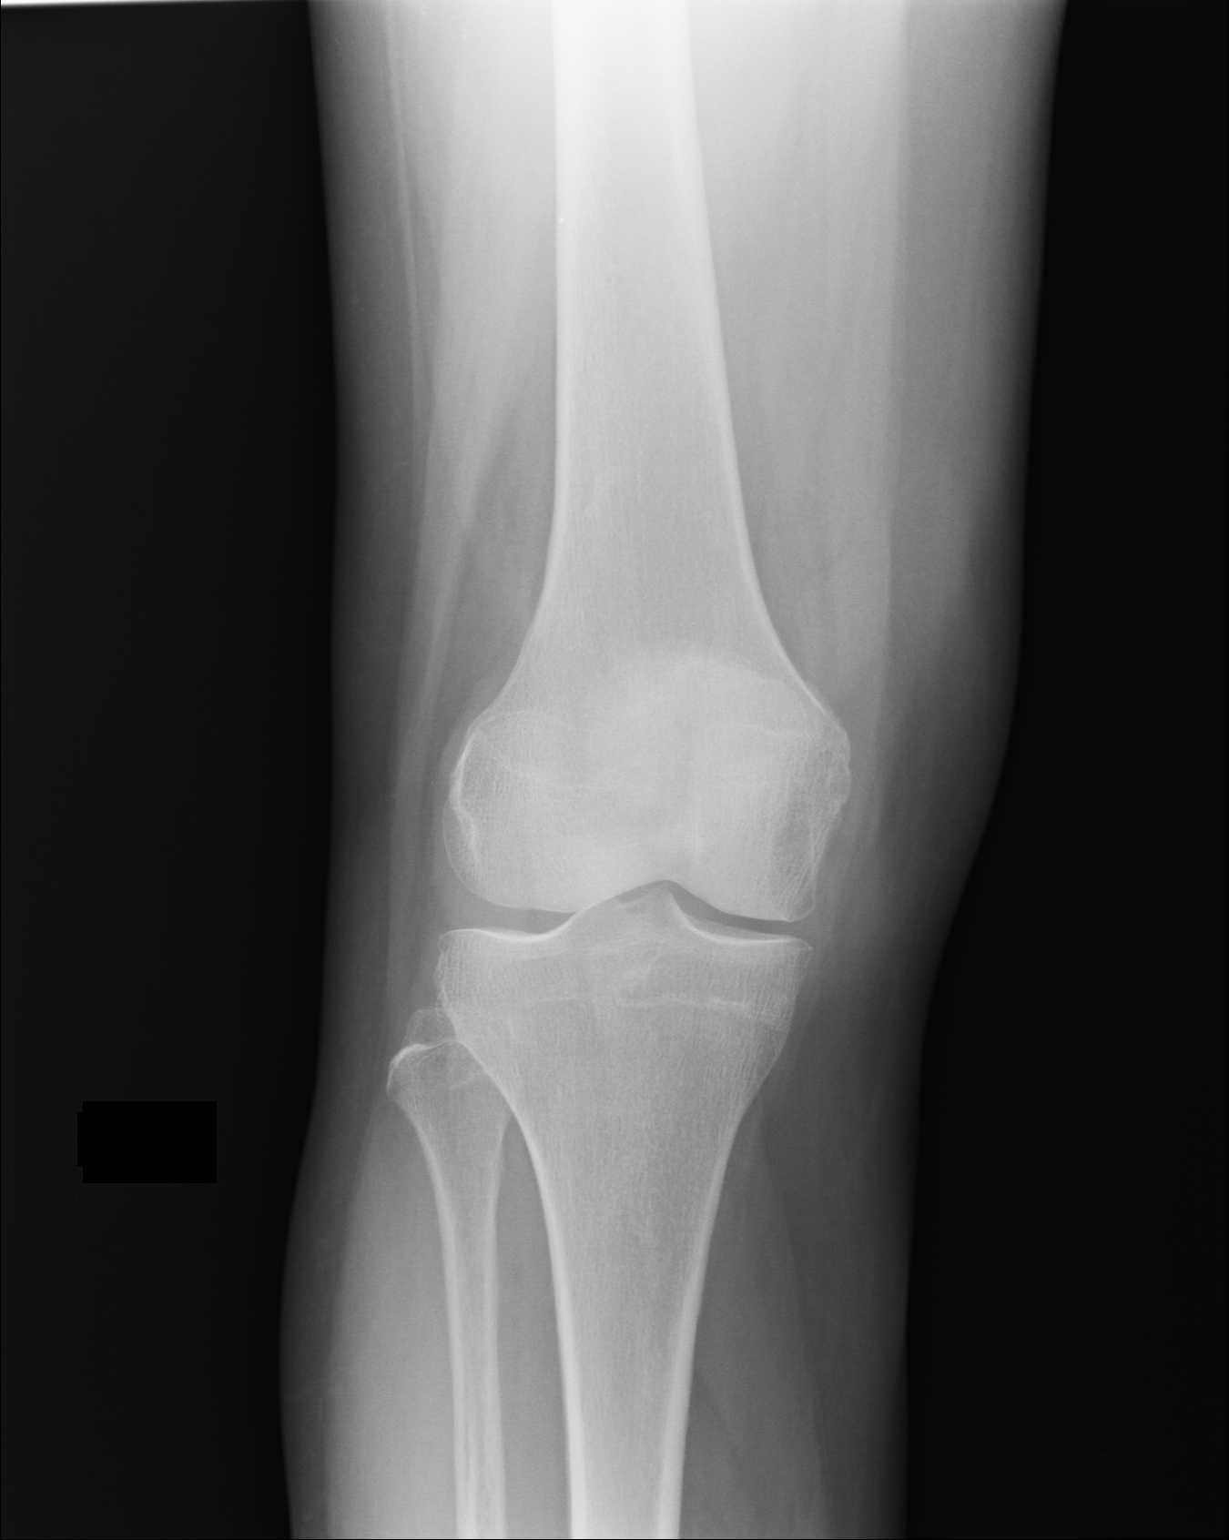
[im 2/3]
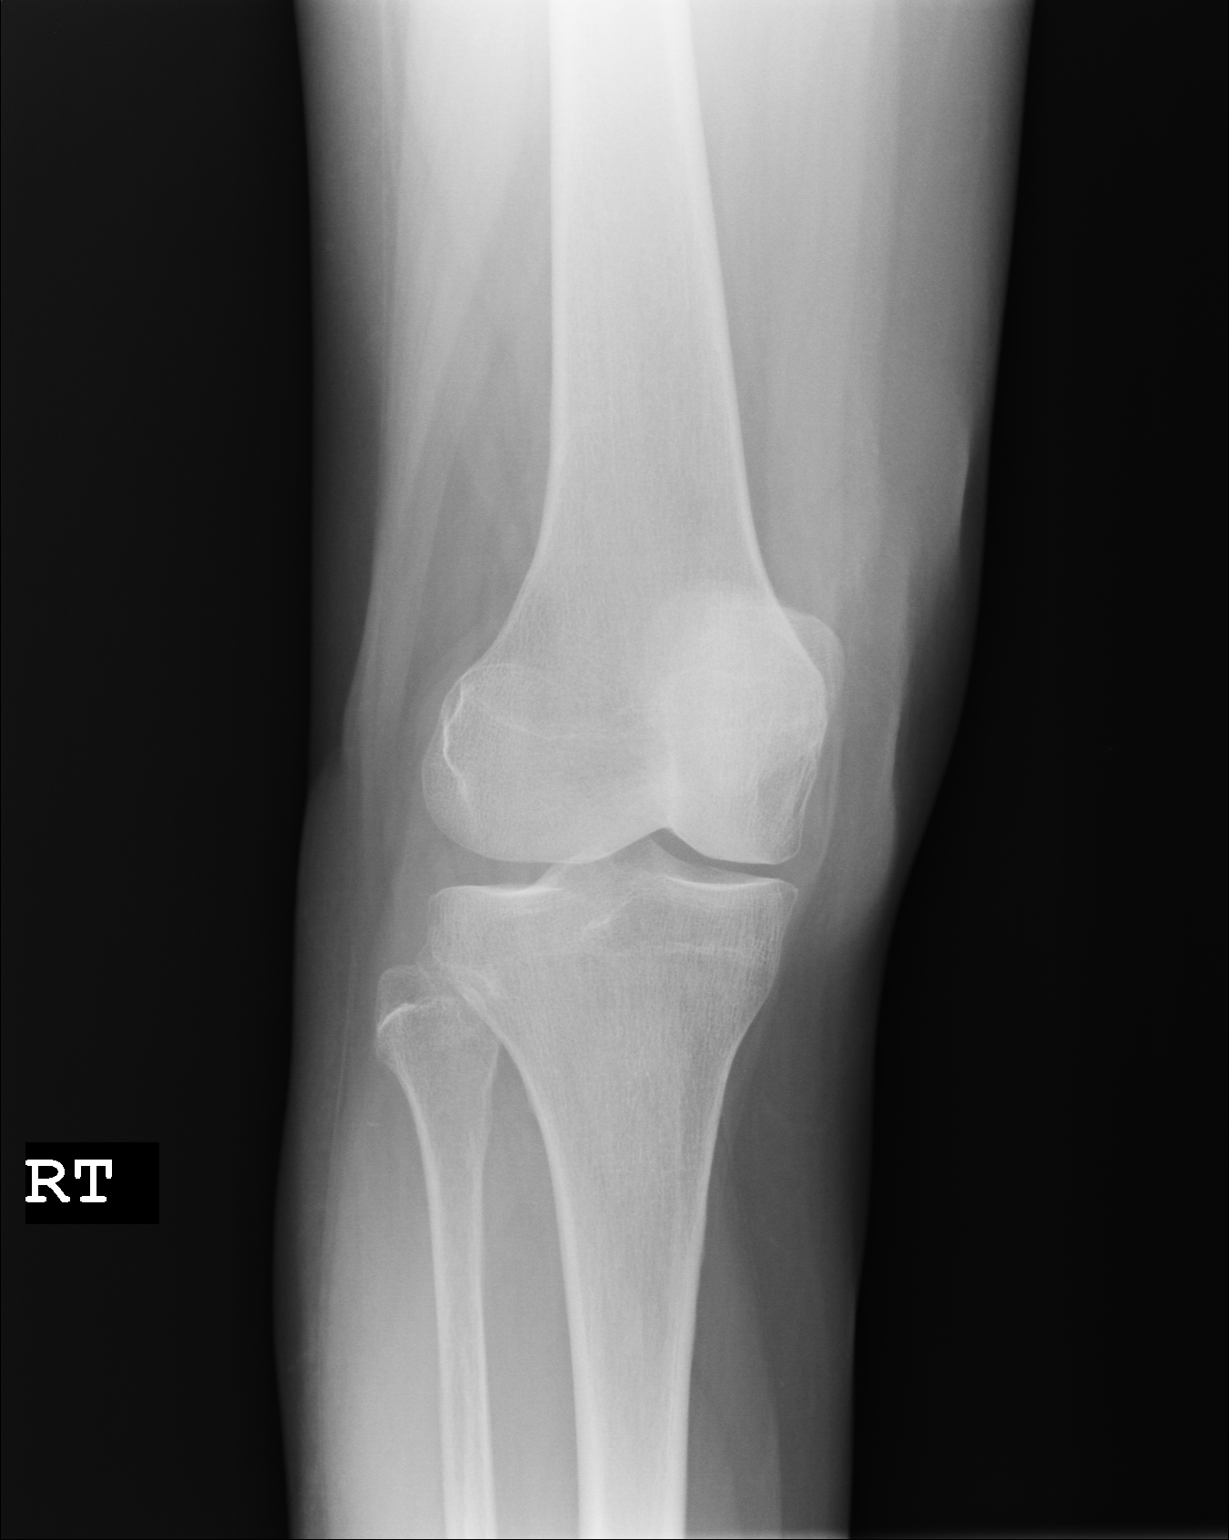
[im 3/3]
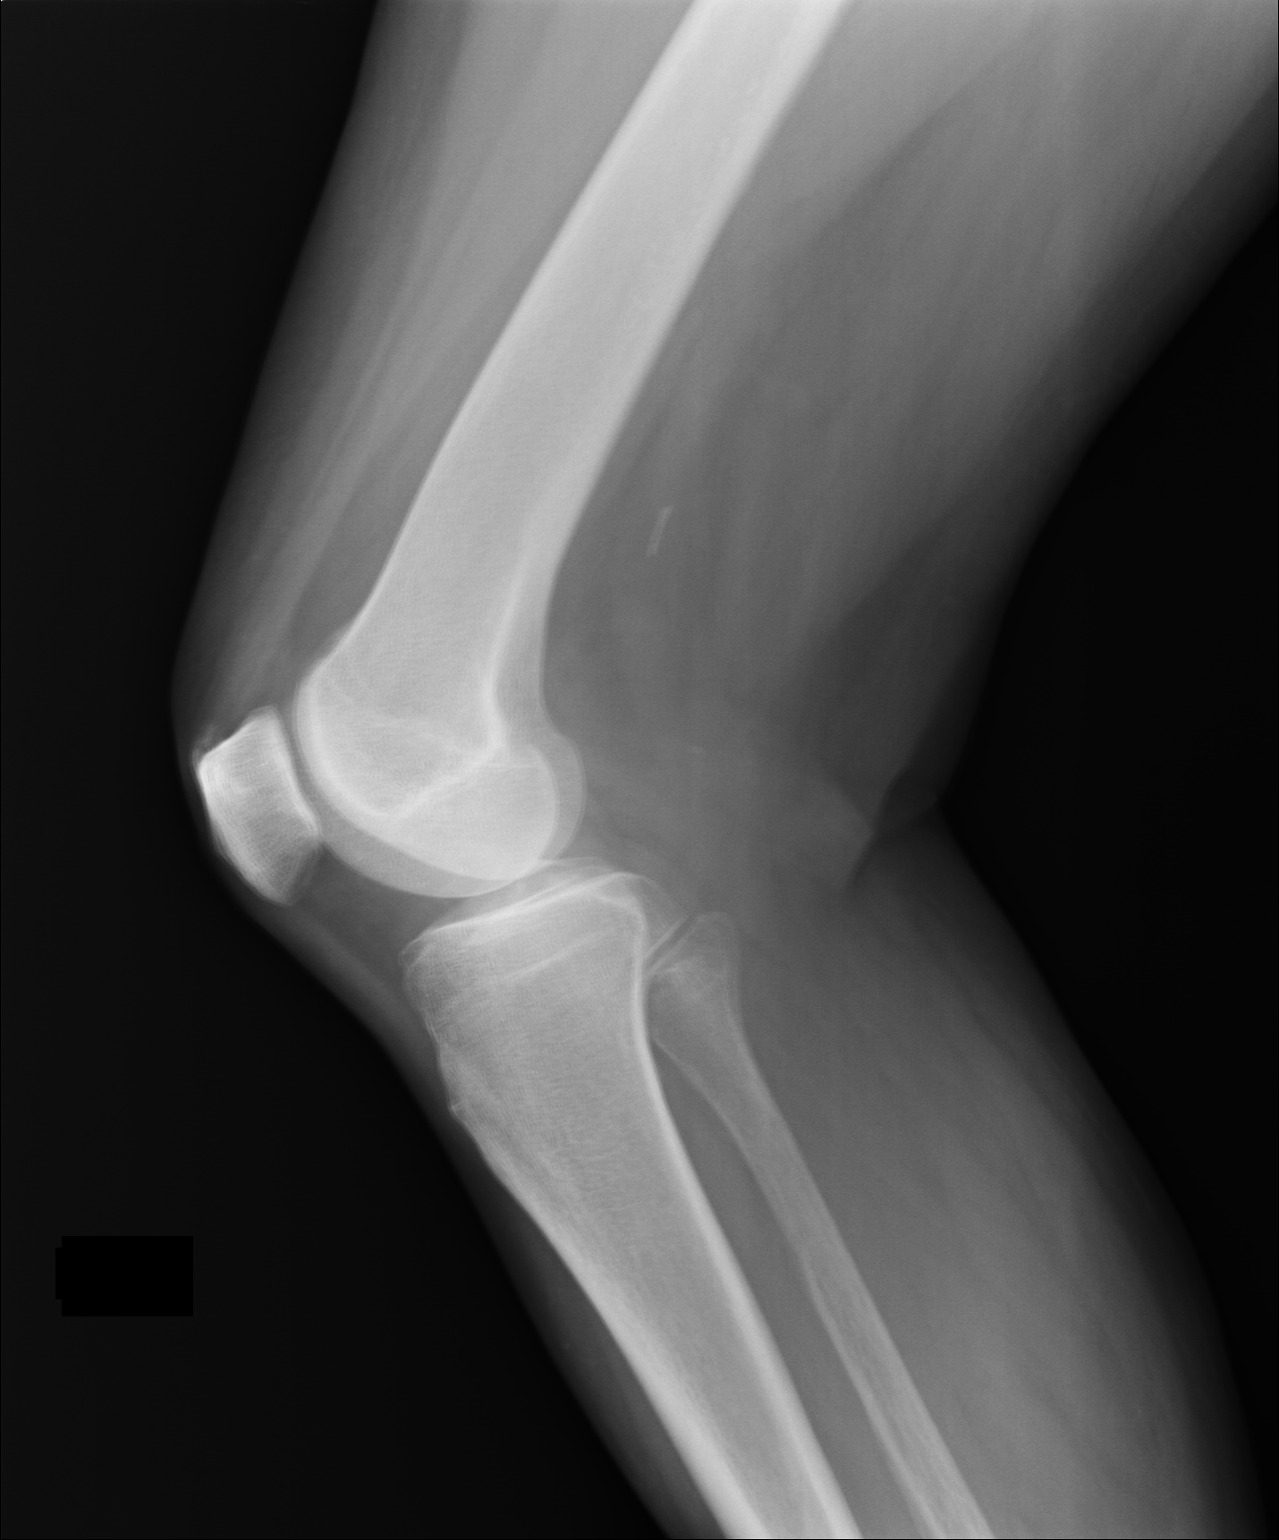

[3 of 3 positions shown; findings below may reference images not displayed]

FINDINGS: There is no acute fracture or subluxation. No significant arthritic changes are seen. There is no suprapatellar effusion. There is no soft tissue abnormality.
IMPRESSION: Unremarkable exam.

## 2021-02-01 DIAGNOSIS — G500 Trigeminal neuralgia: Secondary | ICD-10-CM | POA: Insufficient documentation

## 2021-02-15 DIAGNOSIS — E039 Hypothyroidism, unspecified: Secondary | ICD-10-CM | POA: Insufficient documentation

## 2021-02-15 DIAGNOSIS — K219 Gastro-esophageal reflux disease without esophagitis: Secondary | ICD-10-CM | POA: Insufficient documentation

## 2021-10-04 DIAGNOSIS — H18513 Endothelial corneal dystrophy, bilateral: Secondary | ICD-10-CM | POA: Insufficient documentation

## 2021-10-04 DIAGNOSIS — H2513 Age-related nuclear cataract, bilateral: Secondary | ICD-10-CM | POA: Insufficient documentation

## 2021-10-19 NOTE — H&P (Deleted)
Department of Hematology/Oncology  History and Physical    Name: Kari Gordon  ANV:[id number]  Date of Birth: Apr 17, 1946  Encounter Date: 10/20/2021    REFERRING PROVIDER:  Dario Guardian[place] [address]    REASON FOR OFFICE VISIT:  A patient previously seen at [facility] for evaluation and management of  left breast cancer.    HISTORY OF PRESENT ILLNESS:  Kari Gordon is a 75 y.o. female who presents to today for left breast cancer.  Patient was initially seen at [facility] on May 18, 2016.  In October of 2016 she started having some discomfort in her left breast she had a mammogram completed September 15, 2015.  The mammogram did not reveal any abnormalities at that time.  She continued to have pain in that area so she sought attention of another clinician and had an ultrasound exam of the left breast on February 01, 2016.  The ultrasound showed a 9 mm hypoechoic lesion at the 3 o'clock position within the left breast.  A biopsy was performed by Dr. Mallie Mussel.  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  The patient went on to have a bilateral breast MRI exam on March 01, 2016.  The MRI confirmed the presence of an abnormality at the 3 o'clock position with a 1 cm ovoid mass that had malignant appearing characteristics.  She had a lumpectomy with sentinel lymph node determination on Apr 17, 2016.  She was found to have a 1.1 cm left breast cancer, 1 sentinel lymph node was removed and was free of any metastasis.  The cancer was found to be ER positive/PR positive/HER2 negative.  She was felt to have a T1c N0 M0 lesion.  She had partial accelerated left breast RT and is currently receiving Arimidex.    The patient offers no new complaints at this time.  The patient has had a good appetite and stable weight.  The patient has not noted any new areas of disease on her own self exam.  The patient has  not had any chest pain or dyspnea.  The patient has not had any headaches or changes in vision.  The patient has not had any abdominal pain, nausea, or vomiting.  There have been no changes in bowel or bladder habits.  The patient has not had any abnormal bleeding or clotting episodes.  There have been no complaints of fever, chills, cough, sputum production, dysuria, or diarrhea.    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    ROS:   Review of Systems - Oncology     History:  No past medical history on file.  {Medical History Negative:25850}      {Past Surgical History Negative:25851}        Social History     Socioeconomic History   . Marital status: Not on file     Spouse name: Not on file   . Number of children: Not on file   . Years of education: Not on file   . Highest education level: Not on file   Occupational History   . Not on file   Tobacco Use   . Smoking status: Not on file   . Smokeless tobacco: Not on file   Substance and Sexual Activity   .  Alcohol use: Not on file   . Drug use: Not on file   . Sexual activity: Not on file   Other Topics Concern   . Not on file   Social History Narrative   . Not on file     Social Determinants of Health     Financial Resource Strain: Not on file   Food Insecurity: Not on file   Transportation Needs: Not on file   Physical Activity: Not on file   Stress: Not on file   Intimate Partner Violence: Not on file   Housing Stability: Not on file       Social History     Social History Narrative   . Not on file       Social History     Substance and Sexual Activity   Drug Use Not on file       Family Medical History:    None           No current outpatient medications on file.       Not on File      PHYSICAL EXAM:    ECOG Status: {findings; ecog performance status:31780}   Physical Exam     DIAGNOSTIC  DATA:  Benign mammogram completed last on 05/09/2021.    LABS:   Labs completed last on 04/21/2021 showed WBC 5.9, HGB 13.1, HCT 39.0, PLT 147 K, CEA < 0.3, CA 27.29 8.6.    ASSESSMENT:  No diagnosis found.       PLAN:   1. All relative external and internal medical records were reviewed including available H&Ps, progress notes, procedure notes, imaging's, laboratories, and pathology.   2. All labs from last visit were reviewed with the patient including CBC/differential, CMP, LFTs, LDH ***. Details of exam finding's discussed.       Kari Gordon was given the chance to ask questions, and these were answered to their satisfaction. The patient is welcome to call with any questions or concerns in the meantime.     No follow-ups on file.     Kathrin Penner, FNP-BC  10/19/2021, 14:31    CC:  No primary provider on file.    [place] [address]      This note was partially generated using MModal Fluency Direct system, and there may be some incorrect words, spellings, and punctuation that were not noted in checking the note before saving.

## 2021-10-20 ENCOUNTER — Encounter (INDEPENDENT_AMBULATORY_CARE_PROVIDER_SITE_OTHER): Payer: Self-pay | Admitting: NURSE PRACTITIONER

## 2021-11-10 ENCOUNTER — Other Ambulatory Visit: Payer: Self-pay

## 2021-11-10 ENCOUNTER — Ambulatory Visit (INDEPENDENT_AMBULATORY_CARE_PROVIDER_SITE_OTHER): Payer: Medicare PPO | Admitting: NURSE PRACTITIONER

## 2021-11-10 ENCOUNTER — Encounter (INDEPENDENT_AMBULATORY_CARE_PROVIDER_SITE_OTHER): Payer: Self-pay | Admitting: NURSE PRACTITIONER

## 2021-11-10 VITALS — BP 148/75 | HR 95 | Temp 98.2°F | Ht 66.0 in | Wt 150.2 lb

## 2021-11-10 DIAGNOSIS — Z79811 Long term (current) use of aromatase inhibitors: Secondary | ICD-10-CM

## 2021-11-10 DIAGNOSIS — C50412 Malignant neoplasm of upper-outer quadrant of left female breast: Secondary | ICD-10-CM

## 2021-11-10 DIAGNOSIS — Z6824 Body mass index (BMI) 24.0-24.9, adult: Secondary | ICD-10-CM

## 2021-11-10 DIAGNOSIS — Z170 Estrogen receptor positive status [ER+]: Secondary | ICD-10-CM | POA: Insufficient documentation

## 2021-11-10 MED ORDER — ANASTROZOLE 1 MG TABLET
1.0000 mg | ORAL_TABLET | Freq: Every day | ORAL | 1 refills | Status: DC
Start: 2021-11-10 — End: 2022-06-27

## 2021-11-10 NOTE — H&P (Signed)
Kari Gordon  [id number]  11/25/1946   11/10/2021       Department of Hematology/Oncology  Return Patient Visit           REFERRING PROVIDER:  Dario [place], [place] [address]      REASON FOR OFFICE VISIT:  Ongoing management and evaluation of  Left breast cancer      HISTORY OF PRESENT ILLNESS:  Kari Gordon is a 75 y.o. female who presents to today alone for evaluation of left breast cancer.  Review of Dr. Pierre Bali initial office consultation note dated May 18, 2016 shows that patient was doing well up until October of 2016.  At that time she noticed discomfort in the left breast well taking a shower.  It did not feel discrete mass in the left breast.  She did see her PCP in the mammogram was done September 15, 2015 which did not reveal any distinct abnormalities.  The patient however continued to have this problem and although there were no worsening of her symptoms she wished to have an explanation.  She has seen another PCP and had an ultrasound exam of the left breast on February 01, 2016 is examination showed a 9 x 8 mm hypoechoic lesion at the 3 o'clock position within the left breast.  Biopsy was performed and histologic review of the material showed evidence for infiltrating ductal carcinoma.  The patient went on to have a bilateral breast MRI exam on March 01, 2016.  This exam confirmed the presence of an abnormality at the 3 o'clock position with a 1 cm ovoid mass that had melena.  Characteristics.  He the patient had a resection of disease by lumpectomy and sentinel lymph node determination on Apr 17, 2016.  Primary lesion was 1.1 cm of the left breast and 1 sentinel node was negative for any metastatic disease. her cancer was ER positive PR positive and HER2 negative.  She was felt to have a T1cN0M0 lesion.  Patient was her Arimidex 1 mg p.o. q.day for a minimum of 10 years.    Patient remains on her Arimidex 1 mg PO daily at this time.  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   She continues to follow with her doctor for this.    She had her mammogram for this year and it was benign. Denies fever, chills, diaphoresis, night sweats, and malaise. No infections during the interim.     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  Negative for chills and fever.   HENT:  Negative.    Eyes: Negative.    Respiratory: Negative.  Negative for shortness of breath.    Cardiovascular: Negative.  Negative for chest pain.   Gastrointestinal: Negative.  Negative for diarrhea, nausea and vomiting.   Genitourinary: Negative for difficulty urinating.    Musculoskeletal: Negative.  Negative for arthralgias.   Skin: Negative.    Neurological: Positive for dizziness (Due to her Trigeminal nerve surgery) and headaches (migraines).   Hematological: Negative for adenopathy. Does not bruise/bleed easily.   Psychiatric/Behavioral: Negative.         Past Medical History:  Diagnosis Date   . Cancer (CMS [facility])    . Essential hypertension    . Hx of breast cancer    . Nerve damage    . Sleep apnea    . Unspecified glaucoma(365.9)    . Uterine cancer (CMS [facility])            Social History     Socioeconomic History   . Marital status: Married     Spouse name: Not on file   . Number of children: Not on file   . Years of education: Not on file   . Highest education level: Not on file   Occupational History   . Not on file   Tobacco Use   . Smoking status: Never   . Smokeless tobacco: Never   Vaping Use   . Vaping Use: Never used   Substance and Sexual Activity   . Alcohol use: Never   . Drug use: Never   . Sexual activity: Not on file   Other Topics Concern   . Not on file   Social History Narrative   . Not on file      Social Determinants of Health     Financial Resource Strain: Not on file   Food Insecurity: Not on file   Transportation Needs: Not on file   Physical Activity: Not on file   Stress: Not on file   Intimate Partner Violence: Not on file   Housing Stability: Not on file       Social History     Social History Narrative   . Not on file       Social History     Substance and Sexual Activity   Drug Use Never       Family Medical History:     Problem Relation (Age of Onset)    Glaucoma Brother    Heart Disease Mother, Father    Hypertension (High Blood Pressure) Mother, Father    Stroke Mother, Father            Current Outpatient Medications   Medication Sig   . AIMOVIG AUTOINJECTOR 140 mg/mL Subcutaneous Auto-Injector    . anastrozole (ARIMIDEX) 1 mg Oral Tablet Take 1 Tablet (1 mg total) by mouth Once a day   . aspirin (ECOTRIN) 81 mg Oral Tablet, Delayed Release (E.C.) Take 1 Tablet (81 mg total) by mouth   . atorvastatin (LIPITOR) 40 mg Oral Tablet    . clobetasoL (TEMOVATE) 0.05 % Cream Apply topically   . Clobetasol 0.05 % Gel    . famotidine (PEPCID) 40 mg Oral Tablet    . ketoconazole (NIZORAL) 2 % Cream Apply topically   . latanoprost (XALATAN) 0.005 % Ophthalmic Drops    . meloxicam (MOBIC) 7.5 mg Oral Tablet Take 1 Tablet (7.5 mg total) by mouth   . metoprolol succinate (TOPROL-XL) 100 mg Oral Tablet Sustained Release 24 hr Take 1 Tablet (100 mg total) by mouth   . montelukast (SINGULAIR) 10 mg Oral Tablet    . omeprazole (PRILOSEC) 20 mg Oral Capsule, Delayed Release(E.C.)    . sennosides-docusate sodium (SENOKOT-S) 8.6-50 mg Oral Tablet Take 2 Tablets by mouth Every night   . sumatriptan succinate (IMITREX) 100 mg Oral Tablet    . SYNTHROID 25 mcg Oral Tablet    . turmeric/turmeric ext/pepr ext (TURMERIC-TURMERIC EXT-PEPPER) 500-3 mg Oral Capsule Take 1 Capsule by mouth   . vitamin E 400 unit Oral Capsule Take 1 Capsule (400 Units total) by mouth   .  zolpidem (AMBIEN) 10 mg Oral Tablet        Allergies    Allergen Reactions   . Hydrocodone-Acetaminophen Itching and Nausea/ Vomiting     headache  headache     . Sulfa (Sulfonamides) Rash   . Erythromycin Nausea/ Vomiting         PHYSICAL EXAM:  BP (!) 148/75 (Site: Left, Patient Position: Sitting, Cuff Size: Adult)   Pulse 95   Temp 36.8 C (98.2 F) (Oral)   Ht 1.676 m ('5\' 6"' )   Wt 68.1 kg (150 lb 3.2 oz)   BMI 24.24 kg/m        ECOG Status: (0) Fully active, able to carry on all predisease performance without restriction   Physical Exam  Vitals reviewed.   Constitutional:       Appearance: Normal appearance. She is normal weight.   HENT:      Head: Normocephalic.   Eyes:      Conjunctiva/sclera: Conjunctivae normal.      Pupils: Pupils are equal, round, and reactive to light.   Neck:      Thyroid: No thyroid mass, thyromegaly or thyroid tenderness.   Cardiovascular:      Rate and Rhythm: Normal rate and regular rhythm.      Pulses: Normal pulses.      Heart sounds: Normal heart sounds, S1 normal and S2 normal. No murmur heard.    No S3 or S4 sounds.   Pulmonary:      Effort: Pulmonary effort is normal.      Breath sounds: Normal breath sounds.   Chest:   Breasts:     Right: Normal.       Abdominal:      General: Bowel sounds are normal.      Palpations: Abdomen is soft.   Musculoskeletal:         General: Normal range of motion.      Cervical back: Normal range of motion and neck supple.      Right lower leg: No edema.      Left lower leg: No edema.   Lymphadenopathy:      Head:      Right side of head: No submental, submandibular, preauricular, posterior auricular or occipital adenopathy.      Left side of head: No submental, submandibular, preauricular, posterior auricular or occipital adenopathy.      Cervical: No cervical adenopathy.      Right cervical: No superficial, deep or posterior cervical adenopathy.     Left cervical: No superficial, deep or posterior cervical adenopathy.      Upper Body:      Right upper body: No supraclavicular or axillary  adenopathy.      Left upper body: No supraclavicular or axillary adenopathy.      Lower Body: No right inguinal adenopathy. No left inguinal adenopathy.   Skin:     General: Skin is warm and dry.      Capillary Refill: Capillary refill takes less than 2 seconds.   Neurological:      General: No focal deficit present.      Mental Status: She is alert and oriented to person, place, and time. Mental status is at baseline.      Motor: Motor function is intact.      Coordination: Coordination is intact.      Gait: Gait is intact.   Psychiatric:         Attention and Perception: Attention and perception normal.  Mood and Affect: Mood and affect normal.         Speech: Speech normal.         Behavior: Behavior normal. Behavior is cooperative.         Thought Content: Thought content normal.         Cognition and Memory: Cognition and memory normal.         Judgment: Judgment normal.            RADIOLOGY:  05/09/21 Mammogram BiRads 2 Benign     LABS:   04/25/21   WBC 5.9, Hgb 13.1, Hct 39.0, Plt 147,000  CEA <0.3, CA 27.29 8.6    ASSESSMENT:    ICD-10-CM    1. Malignant neoplasm of upper-outer quadrant of left breast in female, estrogen receptor positive (CMS HCC)  C50.412 CBC/DIFF    Z17.0 COMPREHENSIVE METABOLIC PANEL, NON-FASTING     CARCINOEMBRYONIC ANTIGEN     CA 27,29     CANCER ANTIGEN 15-3      2. Long term (current) use of aromatase inhibitors  Z79.811            PLAN:   1. All relative external and internal medical records were reviewed from Dr. Pierre Bali office including available H&Ps, progress notes, procedure notes, imaging's, laboratories, and pathology.  2. Patient was given lab order for CBC, CMP, CEA, CA 27.29, and CA 15.3.   Refill for Arimidex was sent in for patient. I will see her in 4 months sooner if needed.     Return in about 4 months (around 03/11/2022).     Salvador A Ewer was given the chance to ask questions, and these were answered to their satisfaction. The patient is welcome to call with any  questions or concerns in the meantime.     On the day of the encounter, I spent a total of  48 minutes on this patient encounter including review of historical information, examination, documentation and post-visit activities.     Patrcia Dolly, APRN,FNP-BC  11/10/2021, 10:02     This note was partially generated using MModal Fluency Direct system, and there may be some incorrect words, spellings, and punctuation that were not noted in checking the note before saving.     CC:  Feliberto Harts, DO  [place] [address]

## 2021-11-10 NOTE — Patient Instructions (Signed)
Please take the labs orders given today and go to the first floor registration.  Once registered you will be directed to the lab for completion.  If you choose you may take your labs to [facility] as well.  Please be aware that it does take longer for me to receive results from [facility].  We will call you if needed once your labs are received. I encourage you to keep all your scheduled appointment with physicians.  Mammograms are recommended yearly.  Colonoscopy is recommended starting at age 52 unless symptoms for family history warrant sooner.  Patient with colon cancer screening is based off of when initial diagnosis was and most recent colonoscopy findings.  Immunization recommendations are based on age.   I look forward to seeing you for your next follow up in 4 months.  Please call if you have any questions or concerns.      If you have not already signed up for  [facility], I strongly encourage you to do so.   It will allow to you message [place] with questions, schedule and reschedule appointments. Once the hospital goes to [facility] in March of 2023 it will allow you to see your labs and imaging as well.  If you have not given [place] your e-mail address and you want to sign up for this service, please give check in or check out your e-mail.  You will receive an e-mail and a link to sign up and then will need to download the app on your phone.    Our office number is [phone].  You will be directed to the call center and then a message will be sent to our office.  If you are awaiting a call back from [place] please make sure to answer in toll free call numbers, as that is currently what the phone ID shows when we call from our office.        Thank you for allowing [place] to be part of your healthcare team.

## 2022-03-22 ENCOUNTER — Encounter (INDEPENDENT_AMBULATORY_CARE_PROVIDER_SITE_OTHER): Payer: Self-pay | Admitting: Family

## 2022-03-22 DIAGNOSIS — L659 Nonscarring hair loss, unspecified: Secondary | ICD-10-CM

## 2022-03-22 DIAGNOSIS — B029 Zoster without complications: Secondary | ICD-10-CM

## 2022-03-22 DIAGNOSIS — G4700 Insomnia, unspecified: Secondary | ICD-10-CM

## 2022-03-22 DIAGNOSIS — R300 Dysuria: Secondary | ICD-10-CM

## 2022-03-22 DIAGNOSIS — N390 Urinary tract infection, site not specified: Secondary | ICD-10-CM

## 2022-03-22 DIAGNOSIS — E119 Type 2 diabetes mellitus without complications: Secondary | ICD-10-CM

## 2022-03-22 HISTORY — DX: Insomnia, unspecified: G47.00

## 2022-03-22 HISTORY — DX: Nonscarring hair loss, unspecified: L65.9

## 2022-03-22 HISTORY — DX: Type 2 diabetes mellitus without complications: E11.9

## 2022-03-22 HISTORY — DX: Dysuria: R30.0

## 2022-03-22 HISTORY — DX: Urinary tract infection, site not specified: N39.0

## 2022-03-22 HISTORY — DX: Zoster without complications: B02.9

## 2022-03-23 ENCOUNTER — Ambulatory Visit (HOSPITAL_COMMUNITY): Payer: Self-pay | Admitting: NURSE PRACTITIONER

## 2022-03-27 ENCOUNTER — Encounter (INDEPENDENT_AMBULATORY_CARE_PROVIDER_SITE_OTHER): Payer: Self-pay | Admitting: Family

## 2022-04-17 ENCOUNTER — Other Ambulatory Visit: Payer: Self-pay

## 2022-04-24 ENCOUNTER — Inpatient Hospital Stay
Admission: RE | Admit: 2022-04-24 | Discharge: 2022-04-24 | Disposition: A | Discharge status: Home / Self Care | Payer: Medicare PPO | Source: Ambulatory Visit | Attending: Surgery | Admitting: Surgery

## 2022-04-24 ENCOUNTER — Ambulatory Visit (HOSPITAL_COMMUNITY): Payer: Medicare PPO | Admitting: Certified Registered"

## 2022-04-24 ENCOUNTER — Encounter (HOSPITAL_COMMUNITY): Payer: Medicare PPO | Admitting: Surgery

## 2022-04-24 ENCOUNTER — Other Ambulatory Visit: Payer: Self-pay

## 2022-04-24 ENCOUNTER — Encounter (HOSPITAL_COMMUNITY)
Admission: RE | Disposition: A | Discharge status: Home / Self Care | Payer: Self-pay | Source: Ambulatory Visit | Attending: Surgery

## 2022-04-24 ENCOUNTER — Encounter (HOSPITAL_COMMUNITY): Payer: Self-pay | Admitting: Surgery

## 2022-04-24 DIAGNOSIS — K5730 Diverticulosis of large intestine without perforation or abscess without bleeding: Secondary | ICD-10-CM | POA: Insufficient documentation

## 2022-04-24 DIAGNOSIS — Z1211 Encounter for screening for malignant neoplasm of colon: Secondary | ICD-10-CM | POA: Insufficient documentation

## 2022-04-24 HISTORY — DX: Gastro-esophageal reflux disease without esophagitis: K21.9

## 2022-04-24 HISTORY — DX: Unspecified osteoarthritis, unspecified site: M19.90

## 2022-04-24 HISTORY — DX: Type 2 diabetes mellitus without complications: E11.9

## 2022-04-24 HISTORY — DX: Fibromyalgia: M79.7

## 2022-04-24 SURGERY — COLONOSCOPY
Anesthesia: General | Wound class: Clean Contaminated Wounds-The respiratory, GI, Genital, or urinary

## 2022-04-24 MED ORDER — PROPOFOL 10 MG/ML INTRAVENOUS EMULSION
Freq: Once | INTRAVENOUS | Status: DC | PRN
Start: 2022-04-24 — End: 2022-04-24
  Administered 2022-04-24: 25 mL via INTRAVENOUS

## 2022-04-24 MED ORDER — DEXTROSE 5 % IN WATER (D5W) INTRAVENOUS SOLUTION
INTRAVENOUS | Status: DC
Start: 2022-04-24 — End: 2022-04-24
  Administered 2022-04-24: 0 via INTRAVENOUS

## 2022-04-24 NOTE — OR Surgeon (Signed)
[facility]      Patient Name: Kari, Gordon Number: [id number]  Date of Service: 04/24/2022   Date of Birth: 1945/12/22      Pre-Operative Diagnosis: SCREENING     Post-Operative Diagnosis: NORMAL EXAM    Procedure(s)/Description:  COLONOSCOPY: 64847 (CPT)     Attending Surgeon: Rubye Oaks, MD     Anesthesia:  CRNA: Carmina Miller, CRNA    Anesthesia Type: .General     Specimens Removed: NONE    Patient was taken to the endoscopy suite and given appropriate intravenous sedation.  Videocolonoscope was inserted into the rectum and advanced sequentially to the level of the cecum.  Cecum was confirmed by external palpation, presence of the ileocecal valve and transillumination of the light.  Picture was taken that documents this level.  Colonoscope was then subsequently withdrawn back inspecting all mucosal surfaces.  The ascending colon, transverse colon, descending colon, and sigmoid colon were all visualized with no specific abnormality, ecxept moderately severe sigmoid diverticulosis.  The scope was withdrawn back to the level of the rectum and retroflexed.  The rectal anal junction visualized with no specific abnormality.  Scope was then subsequently straightened and withdrawn.  This concluded the procedure which patient tolerated well.  Based on current recommendations, patient would not need another routine diagnostic colonoscopy for 10 years barring change in presentation and/or symptoms.    FW Mallie Mussel MD FACS

## 2022-04-24 NOTE — Anesthesia Postprocedure Evaluation (Signed)
Anesthesia Post Op Evaluation    Patient: Kari Gordon  Procedure(s):  COLONOSCOPY    Last Vitals:Temperature: 36.7 C (98 F) (04/24/22 1341)  Heart Rate: 71 (04/24/22 1341)  BP (Non-Invasive): 114/61 (04/24/22 1341)  Respiratory Rate: 14 (04/24/22 1341)  SpO2: 100 % (04/24/22 1341)    No notable events documented.    Patient is sufficiently recovered from the effects of anesthesia to participate in the evaluation and has returned to their pre-procedure level.  Patient location during evaluation: PACU       Patient participation: complete - patient participated  Level of consciousness: responsive to physical stimuli    Pain score: 0  Pain management: adequate  Airway patency: patent    Anesthetic complications: no  Cardiovascular status: acceptable  Respiratory status: acceptable  Hydration status: acceptable  Patient post-procedure temperature: Pt Normothermic   PONV Status: Absent

## 2022-04-24 NOTE — Discharge Instructions (Addendum)
SURGICAL DISCHARGE INSTRUCTIONS     Dr. Rubye Oaks, MD  performed your COLONOSCOPY today at the [facility]:  Monday through Friday from 8 a.m. - 4 p.m.: [phone]    For T&D: [phone]  Between 4 p.m. - 8 a.m., weekends and holidays:  Call ER [phone]    PLEASE SEE WRITTEN HANDOUTS AS DISCUSSED BY YOUR NURSE:  Indira Sorenson    SIGNS AND SYMPTOMS OF A WOUND / INCISION INFECTION   Be sure to watch for the following:  Increase in redness or red streaks near or around the wound or incision.  Increase in pain that is intense or severe and cannot be relieved by the pain medication that your doctor has given you.  Increase in swelling that cannot be relieved by elevation of a body part, or by applying ice, if permitted.  Increase in drainage, or if yellow / green in color and smells bad. This could be on a dressing or a cast.  Increase in fever for longer than 24 hours, or an increase that is higher than 101 degrees Fahrenheit (normal body temperature is 98 degrees Fahrenheit). The incision may feel warm to the touch.    **CALL YOUR DOCTOR IF ONE OR MORE OF THESE SIGNS / SYMPTOMS SHOULD OCCUR.    ANESTHESIA INFORMATION   ANESTHESIA -- ADULT PATIENTS:  You have received intravenous sedation / general anesthesia, and you may feel drowsy and light-headed for several hours. You may even experience some forgetfulness of the procedure. DO NOT DRIVE A MOTOR VEHICLE or perform any activity requiring complete alertness or coordination until you feel fully awake in about 24-48 hours. Do not drink alcoholic beverages for at least 24 hours. Do not stay alone, you must have a responsible adult available to be with you. You may also experience a dry mouth or nausea for 24 hours. This is a normal side effect and will disappear as the effects of the medication wear off.    REMEMBER   If you experience any difficulty breathing, chest pain, bleeding that you feel is excessive,  persistent nausea or vomiting or for any other concerns:  Call your physician Dr.  Rubye Oaks, MD   at [phone] . You may also ask to have the general doctor on call paged. They are available to you 24 hours a day.      SPECIAL INSTRUCTIONS / COMMENTS   Today's test only showed Sigmoid Diverticulosis.    FOLLOW-UP APPOINTMENTS   Please keep any and all follow up appointments that have already been scheduled.     Dr Tarri Glenn  [phone]

## 2022-04-24 NOTE — Anesthesia Preprocedure Evaluation (Signed)
ANESTHESIA PRE-OP EVALUATION  Planned Procedure: COLONOSCOPY  Review of Systems     anesthesia history negative     patient summary reviewed  nursing notes reviewed        Pulmonary   sleep apnea and CPAP,   Cardiovascular    Hypertension, ECG reviewed and Negative heart cath ,No peripheral edema,  Exercise Tolerance: > or = 4 METS   ,beta blocker therapy      GI/Hepatic/Renal    GERD        Endo/Other    hypothyroidism and osteoarthritis,   type 2 diabetes/ controlled with diet    Neuro/Psych/MS    fibromyalgia     Cancer  CA,   breast cancer,                   Physical Assessment      Airway       Mallampati: I    TM distance: <3 FB    Neck ROM: full              Dental                    Pulmonary    Breath sounds clear to auscultation  (-) no rhonchi, no decreased breath sounds, no wheezes, no rales and no stridor     Cardiovascular    Rhythm: regular  Rate: Normal  (-) no friction rub, carotid bruit is not present, no peripheral edema and no murmur     Other findings  Intact dentition          Plan  ASA 3     Planned anesthesia type: general     total intravenous anesthesia                        Anesthetic plan and risks discussed with patient  Signed consent obtained            Patient's NPO status is appropriate for Anesthesia.

## 2022-05-30 ENCOUNTER — Other Ambulatory Visit (RURAL_HEALTH_CENTER): Payer: Self-pay | Admitting: Family Medicine

## 2022-05-30 ENCOUNTER — Telehealth (RURAL_HEALTH_CENTER): Payer: Self-pay | Admitting: Family Medicine

## 2022-05-30 MED ORDER — ZOLPIDEM ER 12.5 MG TABLET,EXTENDED RELEASE,MULTIPHASE
12.5000 mg | ORAL_TABLET | Freq: Every evening | ORAL | 0 refills | Status: DC | PRN
Start: 2022-05-30 — End: 2022-06-12

## 2022-05-30 NOTE — Telephone Encounter (Signed)
Refill of Zolpidem Tartrate ER 12.5 Mg

## 2022-06-12 ENCOUNTER — Ambulatory Visit: Payer: Medicare PPO | Attending: Family Medicine | Admitting: Family Medicine

## 2022-06-12 ENCOUNTER — Other Ambulatory Visit: Payer: Self-pay

## 2022-06-12 ENCOUNTER — Encounter (RURAL_HEALTH_CENTER): Payer: Self-pay | Admitting: Family Medicine

## 2022-06-12 ENCOUNTER — Ambulatory Visit (RURAL_HEALTH_CENTER): Payer: Medicare PPO | Attending: Family Medicine | Admitting: Family Medicine

## 2022-06-12 VITALS — BP 124/78 | HR 78 | Temp 99.4°F | Ht 66.0 in | Wt 152.0 lb

## 2022-06-12 DIAGNOSIS — Z853 Personal history of malignant neoplasm of breast: Secondary | ICD-10-CM | POA: Insufficient documentation

## 2022-06-12 DIAGNOSIS — K219 Gastro-esophageal reflux disease without esophagitis: Secondary | ICD-10-CM | POA: Insufficient documentation

## 2022-06-12 DIAGNOSIS — E119 Type 2 diabetes mellitus without complications: Secondary | ICD-10-CM | POA: Insufficient documentation

## 2022-06-12 DIAGNOSIS — Z170 Estrogen receptor positive status [ER+]: Secondary | ICD-10-CM | POA: Insufficient documentation

## 2022-06-12 DIAGNOSIS — C50412 Malignant neoplasm of upper-outer quadrant of left female breast: Secondary | ICD-10-CM | POA: Insufficient documentation

## 2022-06-12 DIAGNOSIS — I10 Essential (primary) hypertension: Secondary | ICD-10-CM | POA: Insufficient documentation

## 2022-06-12 DIAGNOSIS — E039 Hypothyroidism, unspecified: Secondary | ICD-10-CM | POA: Insufficient documentation

## 2022-06-12 DIAGNOSIS — Z780 Asymptomatic menopausal state: Secondary | ICD-10-CM | POA: Insufficient documentation

## 2022-06-12 DIAGNOSIS — E785 Hyperlipidemia, unspecified: Secondary | ICD-10-CM | POA: Insufficient documentation

## 2022-06-12 DIAGNOSIS — G43909 Migraine, unspecified, not intractable, without status migrainosus: Secondary | ICD-10-CM | POA: Insufficient documentation

## 2022-06-12 DIAGNOSIS — G4700 Insomnia, unspecified: Secondary | ICD-10-CM | POA: Insufficient documentation

## 2022-06-12 DIAGNOSIS — G500 Trigeminal neuralgia: Secondary | ICD-10-CM | POA: Insufficient documentation

## 2022-06-12 DIAGNOSIS — M8580 Other specified disorders of bone density and structure, unspecified site: Secondary | ICD-10-CM | POA: Insufficient documentation

## 2022-06-12 LAB — CBC
HCT: 38.8 % (ref 37.0–47.0)
HGB: 13.4 g/dL (ref 12.5–16.0)
MCH: 33.1 pg — ABNORMAL HIGH (ref 27.0–32.0)
MCHC: 34.4 g/dL (ref 32.0–36.0)
MCV: 96.3 fL (ref 78.0–99.0)
MPV: 7.9 fL (ref 7.4–10.4)
PLATELETS: 179 10*3/uL (ref 140–440)
RBC: 4.03 10*6/uL — ABNORMAL LOW (ref 4.20–5.40)
RDW: 13 % (ref 11.6–14.8)
WBC: 6.3 10*3/uL (ref 4.0–10.5)
WBCS UNCORRECTED: 6.3 10*3/uL

## 2022-06-12 LAB — COMPREHENSIVE METABOLIC PANEL, NON-FASTING
ALBUMIN/GLOBULIN RATIO: 1.6 — ABNORMAL HIGH (ref 0.8–1.4)
ALBUMIN: 4.5 g/dL (ref 3.5–5.7)
ALKALINE PHOSPHATASE: 60 U/L (ref 34–104)
ALT (SGPT): 16 U/L (ref 7–52)
ANION GAP: 6 mmol/L — ABNORMAL LOW (ref 10–20)
AST (SGOT): 19 U/L (ref 13–39)
BILIRUBIN TOTAL: 0.7 mg/dL (ref 0.3–1.2)
BUN/CREA RATIO: 13 (ref 6–22)
BUN: 12 mg/dL (ref 7–25)
CALCIUM, CORRECTED: 9.5 mg/dL (ref 8.9–10.8)
CALCIUM: 10 mg/dL (ref 8.6–10.3)
CHLORIDE: 106 mmol/L (ref 98–107)
CO2 TOTAL: 28 mmol/L (ref 21–31)
CREATININE: 0.9 mg/dL (ref 0.60–1.30)
ESTIMATED GFR: 67 mL/min/{1.73_m2} (ref >59)
GLOBULIN: 2.8 — ABNORMAL LOW (ref 2.9–5.4)
GLUCOSE: 132 mg/dL — ABNORMAL HIGH (ref 74–109)
OSMOLALITY, CALCULATED: 281 mOsm/kg (ref 270–290)
POTASSIUM: 4 mmol/L (ref 3.5–5.1)
PROTEIN TOTAL: 7.3 g/dL (ref 6.4–8.9)
SODIUM: 140 mmol/L (ref 136–145)

## 2022-06-12 LAB — LIPID PANEL
CHOL/HDL RATIO: 2.8
CHOLESTEROL: 132 mg/dL (ref <200)
HDL CHOL: 48 mg/dL (ref 23–92)
LDL CALC: 56 mg/dL (ref 0–100)
TRIGLYCERIDES: 138 mg/dL (ref <=150)
VLDL CALC: 28 mg/dL (ref 0–50)

## 2022-06-12 LAB — MICROALBUMIN/CREATININE RATIO, URINE, RANDOM
CREATININE RANDOM URINE: 46 mg/dL — ABNORMAL HIGH (ref 11–26)
MICROALBUMIN RANDOM URINE: 1.9 mg/dL
MICROALBUMIN/CREATININE RATIO RANDOM URINE: 41.3 mg/g

## 2022-06-12 LAB — THYROID STIMULATING HORMONE (SENSITIVE TSH): TSH: 1.865 u[IU]/mL (ref 0.450–5.330)

## 2022-06-12 MED ORDER — METOPROLOL SUCCINATE ER 25 MG TABLET,EXTENDED RELEASE 24 HR
25.0000 mg | ORAL_TABLET | Freq: Every day | ORAL | 3 refills | Status: DC
Start: 2022-06-12 — End: 2022-06-12

## 2022-06-12 MED ORDER — FAMOTIDINE 40 MG TABLET
40.0000 mg | ORAL_TABLET | Freq: Every evening | ORAL | 3 refills | Status: DC
Start: 2022-06-12 — End: 2022-12-13

## 2022-06-12 MED ORDER — ZOLPIDEM 10 MG TABLET
10.0000 mg | ORAL_TABLET | Freq: Every evening | ORAL | 1 refills | Status: DC | PRN
Start: 2022-06-12 — End: 2022-06-12

## 2022-06-12 MED ORDER — ATORVASTATIN 40 MG TABLET
40.0000 mg | ORAL_TABLET | Freq: Every evening | ORAL | 3 refills | Status: DC
Start: 2022-06-12 — End: 2022-12-13

## 2022-06-12 MED ORDER — SYNTHROID 25 MCG TABLET
25.0000 ug | ORAL_TABLET | Freq: Every morning | ORAL | 3 refills | Status: DC
Start: 2022-06-12 — End: 2022-06-12

## 2022-06-12 MED ORDER — SUCRALFATE 1 GRAM TABLET
1.0000 g | ORAL_TABLET | Freq: Four times a day (QID) | ORAL | 2 refills | Status: DC
Start: 2022-06-12 — End: 2022-12-13

## 2022-06-12 MED ORDER — OMEPRAZOLE 20 MG CAPSULE,DELAYED RELEASE
20.0000 mg | DELAYED_RELEASE_CAPSULE | Freq: Every day | ORAL | 3 refills | Status: DC
Start: 2022-06-12 — End: 2022-12-13

## 2022-06-12 MED ORDER — ATORVASTATIN 40 MG TABLET
40.0000 mg | ORAL_TABLET | Freq: Every evening | ORAL | 3 refills | Status: DC
Start: 2022-06-12 — End: 2022-06-12

## 2022-06-12 MED ORDER — SYNTHROID 25 MCG TABLET
25.0000 ug | ORAL_TABLET | Freq: Every morning | ORAL | 3 refills | Status: DC
Start: 2022-06-12 — End: 2022-12-13

## 2022-06-12 MED ORDER — LOSARTAN 50 MG TABLET
50.0000 mg | ORAL_TABLET | Freq: Every day | ORAL | 4 refills | Status: DC
Start: 2022-06-12 — End: 2022-06-12

## 2022-06-12 MED ORDER — LOSARTAN 50 MG TABLET
50.0000 mg | ORAL_TABLET | Freq: Every day | ORAL | 4 refills | Status: DC
Start: 2022-06-12 — End: 2022-12-13

## 2022-06-12 MED ORDER — OMEPRAZOLE 20 MG CAPSULE,DELAYED RELEASE
20.0000 mg | DELAYED_RELEASE_CAPSULE | Freq: Every day | ORAL | 3 refills | Status: DC
Start: 2022-06-12 — End: 2022-06-12

## 2022-06-12 MED ORDER — ZOLPIDEM 10 MG TABLET
10.0000 mg | ORAL_TABLET | Freq: Every evening | ORAL | 1 refills | Status: DC | PRN
Start: 2022-06-12 — End: 2022-12-13

## 2022-06-12 MED ORDER — FAMOTIDINE 40 MG TABLET
40.0000 mg | ORAL_TABLET | Freq: Every evening | ORAL | 3 refills | Status: DC
Start: 2022-06-12 — End: 2022-06-12

## 2022-06-12 NOTE — Progress Notes (Signed)
FAMILY MEDICINE, [place] [id number]  Operated by [facility]     Name: Kari Gordon MRN:  [id number]   Date of Birth: [birth year] Age: 76 y.o.   Date: 06/12/2022  Time: 10:33     Provider: Feliberto Harts, DO    Assessment/Plan:    1. Type 2 diabetes, diet controlled (CMS HCC)  Good control on diet with recent spike. Will repeat lab today    2. Hypothyroidism  Continue synthroid. Recheck TSH. Last level 3.    3. Right trigeminal neuralgia  S/p surgery with sx improvement. Still seeing neurology for migraines    4. Insomnia  Change Ambien back to 10 mg daily. Pt has tried 22m and it does not help at all    5. GERD (gastroesophageal reflux disease)  Occ uses Carafate. Uses Pepcid and omeprazole regularly with good relief except when under stress    6. 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 Had a mammogram with Dr. BMallie Mussel He is looking with u/s as he did see something he wants to evaluate further. Per the pt, the lymph node incision site was not marked and she is hopeful that the u/s will come back ok.     7. Hyperlipidemia LDL goal <70     8. osteopenia  Last bone density was in 2019 and showed osteopenia    9. Post menopausal  Repeat bone density. Order given to pt to schedule at her convenience    10. HTN  Stop metoprolol. Prescribed cozaar.    11. Migraine  F/u with neurology. Metoprolol did not prevent migraines. Wean off by decreasing to 1/2 pill daily for two weeks and then discontinue     Discontinued Medications    METOPROLOL SUCCINATE (TOPROL-XL) 100 MG ORAL TABLET SUSTAINED RELEASE 24 HR    Take 1 Tablet (100 mg total) by mouth      Reason for visit: Follow Up 3 Months (Cdm follow up and refills)      History of Present Illness:  Kari HOARis a 76y.o. [place] presenting with GERD, hyperlipidemia, hypertension,  osteoarthritis, sleep apnea, and vitamin D deficiency who presents in six month follow-up.    Trigeminal Neuralgia: significantly better post op   Hyperlipidemia: tolerating atorvastatin 496mat night. She is taking coenzyme q 10 with this with good control    Constipation: recent colonoscopy with Dr. BaMallie Mussel   Hyperglycemia: She manages with her diet. Last HgbA1c was 6.2%. Most recently, blood sugar was elevated in 140's which is not usual for her.    Insomnia: Wants to go back on 10 mg of Ambien. 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    TREATMENT INTERVAL: 04/29/2016 she completed 34 Gy with hyperfractionated high dose rate brachytherapy.    COMMENT: She is clinically disease free 5 years, 10 months from diagnosis in March 2017. She continues on Arimidex. Medical oncology will decide when she is safe to discontinue this.    Migraine headaches:  Did not get relief with use of nurtec. Episodes of dizziness; seeing neurologist with better control of migraines    GERD:  Has increased reflux. Wants to  have carafate to take as needed for heartburn breakthru on omeprazole 20 mg    Historical Data    Past Medical History:  Past Medical History:   Diagnosis Date   . Alopecia 03/22/2022   . Cancer (CMS [place])    . Diabetes mellitus, type 2 (CMS HCC)    . Dysuria 03/22/2022   . Esophageal reflux    . Essential hypertension    . Fibromyalgia    . Herpes zoster 03/22/2022   . Hx of breast cancer    . Insomnia 03/22/2022   . Nerve damage    . Osteoarthritis    . Sleep apnea    . Type 2 diabetes, diet controlled (CMS [place]) 03/22/2022   . Unspecified glaucoma(365.9)    . Uterine cancer (CMS [place])    . UTI (urinary tract infection) 03/22/2022         Past Surgical History:  Past Surgical History:   Procedure Laterality Date   . COLONOSCOPY       dr Mallie Mussel 04/25/22 diverticular disease   . EYE SURGERY      cornea and cataract surgery 12/29/21 left 05/04/22 right   . HX BREAST LUMPECTOMY     . HX CHOLECYSTECTOMY     . HX HEART CATHETERIZATION     . HX HYSTERECTOMY     . LYMPH NODE BIOPSY     . NERVE SURGERY     . NOSE SURGERY     . UTERINE FIBROID SURGERY           Allergies:  Allergies   Allergen Reactions   . Hydrocodone-Acetaminophen Itching and Nausea/ Vomiting     headache  headache     . Sulfa (Sulfonamides) Rash   . Duloxetine Mental Status Effect   . Erythromycin Nausea/ Vomiting     Medications:  AIMOVIG AUTOINJECTOR 140 mg/mL Subcutaneous Auto-Injector,   anastrozole (ARIMIDEX) 1 mg Oral Tablet, Take 1 Tablet (1 mg total) by mouth Once a day  aspirin (ECOTRIN) 81 mg Oral Tablet, Delayed Release (E.C.), Take 1 Tablet (81 mg total) by mouth  atorvastatin (LIPITOR) 40 mg Oral Tablet,   clobetasoL (TEMOVATE) 0.05 % Cream, Apply topically  Clobetasol 0.05 % Gel,   famotidine (PEPCID) 40 mg Oral Tablet,   ketoconazole (NIZORAL) 2 % Cream, Apply topically  latanoprost (XALATAN) 0.005 % Ophthalmic Drops,   meloxicam (MOBIC) 7.5 mg Oral Tablet, Take 1 Tablet (7.5 mg total) by mouth  metoprolol succinate (TOPROL-XL) 25 mg Oral Tablet Sustained Release 24 hr, Take 1 Tablet (25 mg total) by mouth Once a day  montelukast (SINGULAIR) 10 mg Oral Tablet,   omeprazole (PRILOSEC) 20 mg Oral Capsule, Delayed Release(E.C.), 1 Capsule (20 mg total) Once a day  sennosides-docusate sodium (SENOKOT-S) 8.6-50 mg Oral Tablet, Take 2 Tablets by mouth Every night  sumatriptan succinate (IMITREX) 100 mg Oral Tablet,   SYNTHROID 25 mcg Oral Tablet,   turmeric/turmeric ext/pepr ext (TURMERIC-TURMERIC EXT-PEPPER) 500-3 mg Oral Capsule, Take 1 Capsule by mouth (Patient not taking: Reported on 06/12/2022)  ubrogepant (UBRELVY) 100 mg Oral Tablet, Take by mouth One tab every 2 hr prn migraine  vitamin E 400 unit Oral Capsule, Take 1 Capsule (400 Units total) by mouth (Patient not  taking: Reported on 06/12/2022)  zolpidem (AMBIEN CR) 12.5 mg Oral Tablet, Multiphasic Release, Take 1 Tablet (12.5 mg total) by mouth Every night as needed for Insomnia (Patient not taking: Reported on 06/12/2022)  metoprolol succinate (TOPROL-XL) 100 mg Oral Tablet Sustained Release 24 hr,  Take 1 Tablet (100 mg total) by mouth    No facility-administered medications prior to visit.     Family History:  Family Medical History:     Problem Relation (Age of Onset)    Glaucoma Brother    Heart Disease Mother, Father    Hypertension (High Blood Pressure) Mother, Father    Stroke Mother, Father          Social History:  Social History     Socioeconomic History   . Marital status: Married   . Number of children: 2   Tobacco Use   . Smoking status: Never   . Smokeless tobacco: Never   Vaping Use   . Vaping Use: Never used   Substance and Sexual Activity   . Alcohol use: Never   . Drug use: Never   . Sexual activity: Not Currently     Partners: Male           Review of Systems:  Any pertinent Review of Systems as addressed in the HPI above.    Physical Exam:  Vital Signs:  Vitals:    06/12/22 0929   BP: 124/78   Pulse: 78   Temp: 37.4 C (99.4 F)   TempSrc: Tympanic   SpO2: 99%   Weight: 68.9 kg (152 lb)   Height: 1.676 m ('5\' 6"' )   BMI: 24.58     Physical Exam    General: cooperative, healthy appearing, no acute distress and alert  Orientation/Consciousness: patient oriented x3  HENMT  Ears: hearing grossly normal bilaterally, TM normal on the right and TM normal on the left  Mouth: oropharynx normal  Eyes  Sclera: sclerae normal  Neck  Neck: no lymphadenopathy  Resp  Effort & Inspection: normal respiratory effort  Auscultation: clear to auscultation bilaterally  Cardio  Jugular venous pressure: no JVD  Rate: regular rate  Rhythm: regular rhythm  Heart Sounds: S1 normal, S2 normal and no rubs  Bruits: no carotid bruits  Pulses: normal peripheral pulses  GI  Palpation: soft and no hepatosplenomegaly  Skin  Lesions: no  lesions  Rashes: no rashes  Neuro  General: patient oriented x3, tone normal and moves all extremities  Cognition: normal cognition  Extrem  General: no clubbing, no cyanosis and no edema  Psych  Mental Status: mental status grossly normal  Speech and Movement: speech and movement normal  Mood: congruent mood  Affect: normal affect  Attitude: cooperative      [place], DO     Portions of this note may be dictated using voice recognition software or a dictation service. Variances in spelling and vocabulary are possible and unintentional. Not all errors are caught/corrected. Please notify the [facility] if any discrepancies are noted or if the meaning of any statement is not clear.

## 2022-06-13 LAB — HGA1C (HEMOGLOBIN A1C WITH EST AVG GLUCOSE): HEMOGLOBIN A1C: 6.1 % — ABNORMAL HIGH (ref 4.0–6.0)

## 2022-06-16 ENCOUNTER — Encounter (RURAL_HEALTH_CENTER): Payer: Self-pay | Admitting: Family Medicine

## 2022-06-20 NOTE — Result Encounter Note (Signed)
Message left for patient to call back for results/br

## 2022-06-21 ENCOUNTER — Other Ambulatory Visit (HOSPITAL_COMMUNITY): Payer: Self-pay | Admitting: PSYCHIATRY AND NEUROLOGY-NEUROLOGY

## 2022-06-21 ENCOUNTER — Telehealth (RURAL_HEALTH_CENTER): Payer: Self-pay | Admitting: Family Medicine

## 2022-06-21 DIAGNOSIS — G43009 Migraine without aura, not intractable, without status migrainosus: Secondary | ICD-10-CM

## 2022-06-21 NOTE — Telephone Encounter (Signed)
Patient informed of results she is to keep BP undercontrol 130/80or less no NSAID she is does not take NSAID  chol good control return at next visit to repeat labs/br

## 2022-06-21 NOTE — Telephone Encounter (Signed)
-----   Message from [place], [place] sent at 06/19/2022  6:54 AM EDT -----  Good control with diet of your blood sugar  You do spill too much protein in your urine. Need to make sure BP is in good control at home at <130/<80 and keep blood sugar under 110 to protect your kidneys. Continue cozaar as this is supposed to help protect kidneys as well as manage BP.  Avoid NSAIDs. Will recheck in 3-6 months at follow up. May have to adjust meds if persist  Cholesterol in excellent control. Continue meds

## 2022-06-27 ENCOUNTER — Other Ambulatory Visit (HOSPITAL_COMMUNITY): Payer: Self-pay | Admitting: NURSE PRACTITIONER

## 2022-06-27 MED ORDER — ANASTROZOLE 1 MG TABLET
1.0000 mg | ORAL_TABLET | Freq: Every day | ORAL | 1 refills | Status: DC
Start: 2022-06-27 — End: 2023-06-13

## 2022-07-01 ENCOUNTER — Ambulatory Visit (HOSPITAL_COMMUNITY): Payer: Self-pay

## 2022-07-03 ENCOUNTER — Inpatient Hospital Stay
Admission: RE | Admit: 2022-07-03 | Discharge: 2022-07-03 | Disposition: A | Discharge status: Home / Self Care | Payer: Medicare PPO | Source: Ambulatory Visit | Attending: PSYCHIATRY AND NEUROLOGY-NEUROLOGY | Admitting: PSYCHIATRY AND NEUROLOGY-NEUROLOGY

## 2022-07-03 ENCOUNTER — Other Ambulatory Visit: Payer: Self-pay

## 2022-07-03 DIAGNOSIS — G43009 Migraine without aura, not intractable, without status migrainosus: Secondary | ICD-10-CM | POA: Insufficient documentation

## 2022-08-08 ENCOUNTER — Other Ambulatory Visit (RURAL_HEALTH_CENTER): Payer: Self-pay | Admitting: Family Medicine

## 2022-08-08 DIAGNOSIS — G500 Trigeminal neuralgia: Secondary | ICD-10-CM

## 2022-09-04 ENCOUNTER — Encounter (INDEPENDENT_AMBULATORY_CARE_PROVIDER_SITE_OTHER): Payer: Self-pay | Admitting: NEUROLOGY

## 2022-09-29 ENCOUNTER — Ambulatory Visit (INDEPENDENT_AMBULATORY_CARE_PROVIDER_SITE_OTHER): Payer: Medicare PPO | Admitting: NEUROLOGY

## 2022-09-29 ENCOUNTER — Other Ambulatory Visit: Payer: Self-pay

## 2022-09-29 ENCOUNTER — Encounter (INDEPENDENT_AMBULATORY_CARE_PROVIDER_SITE_OTHER): Payer: Self-pay | Admitting: NEUROLOGY

## 2022-09-29 DIAGNOSIS — G500 Trigeminal neuralgia: Secondary | ICD-10-CM

## 2022-09-29 NOTE — Progress Notes (Signed)
ASSESSMENT  MIGRAINE WITHOUT AURA:  She is a 76 year old woman who is referred for longstanding history headache.  Headaches meet criteria for migraine.  They are unilateral, associated with photo and phonophobia, nausea and worsened with activity.  She currently has 25-30 headache days per month.  MRI of the brain is largely unremarkable.  Neurologic exam today is nonfocal.  She has this point failed multiple classes of medications including beta-blockers, ACE inhibitors, SNRI, antiepileptics and CGRP antagonists.  As she has just recently started Ajovy, we will continue this medication for at least 2 more months and assess improvement.  She can continue to use Ubrelvy as an abortive medication as it has been helpful.  We did discuss her overuse of Tylenol.  We will try to limit the use of this or any other abortive medications to no more than 2-3 times per week.  If she is still not seeing improvement in her headaches at next visit, we will likely move onto Botox therapy.  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  She has been prescribed therapy to address this and I have encouraged her to follow through with this as it may help decrease headache burden.  DIZZINESS:  I believe this is likely related to 1 as workup and treatment has otherwise not lead to an alternative diagnosis.  We will continue to monitor this symptom to see if it improves with treatment of her headaches.    PLAN  1. Continue Ajovy injections monthly      Continue Ubrelvy p.r.n.      Counseled on medication overuse headache      Encouraged aggressive hydration      Encouraged her to keep a headache journal      Encouraged her to follow-up with physical therapy  2. Continue to monitor    Thank you for allowing me to participate in your patient's care and please do not hesitate to contact me for any questions or concerns.    Adrian Prows, DO  Assistant  Professor of Neurology  [facility]     I personally spent a total of 60 minutes today preparing to see the patient, in the encounter with the patient, and documenting after the visit.    ==========================================================================================================================================    NAME:  Kari [place]  DOB:  [birth year]  VISIT DATE:  09/29/2022    CC:  Trigeminal neuralgia    Patient seen in consultation at the request of Dr. Feliberto Harts  History obtained from the patient and chart/records  Age of patient:  76 y.o.      HPI:   I had the pleasure of seeing your patient in neurology clinic for an outpatient consultation, who is a 76 y.o. year old female who was referred for evaluation of trigeminal neuralgia.  Please allow me to summarize the history for the record.    She states she has a longstanding history of migraine dating back to childhood.  She states that they are characterized by right neck pain that radiate into the temporal.  At times she can have headaches that begin between the eyes.  They are associated with photo and phonophobia as well as nausea.  They are worsened with activity.  She does have sensitivity to smells as well.  These headaches can last days.  She typically has more mild headaches on a daily basis and then we  will have 2-3 incapacitating headaches per month that last multiple days.  She is taking Tylenol on a daily basis.  Currently she is on Ajovy and has been for the past 3 months and finds it somewhat helpful in that she will get some headache-free days for a few days following a dose.  She is using [place] as an abortive treatment.  This has been quite helpful and she can often curtail the stronger headaches with this medication.  She is currently reserving the use of this for most severe headaches.  She has seen multiple    Previous medications:  Cymbalta  Irbesartan  Aimovig - initially  effective, then lost efficacy  Topamax - ineffective  Nurtec - ineffective  Metoprolol - ineffective    Current medications:   Melodie Bouillon    Of note she does have a history of right trigeminal neuralgia.  This started in 2019 or 2020.  Angiography revealed contact with the nerve by nearby vessel.  She was referred to [facility] and underwent neurosurgical procedure to ameliorate her symptoms.  This was successful and she is not had symptoms since the procedure.  Had trigeminal neuralgia w neurologist to this point.  hich started in 2019 or 2020. Used medications which were not helpful. Then was referred to [facility] by Dr. Candelaria Stagers and had surgery which was successful.     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    Malignant neoplasm of upper-outer quadrant of left breast in female, estrogen receptor positive (CMS HCC)     Adenocarcinoma of endometrium (CMS HCC)    Fuchs' corneal dystrophy of both eyes    GERD (gastroesophageal reflux disease)    Hypothyroidism    Lichen sclerosus et atrophicus    Right trigeminal neuralgia    Age-related nuclear cataract of both eyes    Abnormal cardiovascular stress test    UTI (urinary tract infection)    Alopecia    Insomnia    Herpes zoster    Dysuria    Type 2 diabetes, diet controlled (CMS [facility])     Past Surgical History:   Procedure Laterality Date    COLONOSCOPY      dr Mallie Mussel 04/25/22 diverticular disease    EYE SURGERY      cornea and cataract surgery 12/29/21 left 05/04/22 right    HX BREAST LUMPECTOMY      HX CHOLECYSTECTOMY      HX HEART CATHETERIZATION      HX HYSTERECTOMY      LYMPH NODE BIOPSY      NERVE SURGERY      NOSE SURGERY      UTERINE FIBROID SURGERY           Family Medical History:       Problem Relation (Age of Onset)    Glaucoma Brother     Heart Disease Mother, Father    Hypertension (High Blood Pressure) Mother, Father    Stroke Mother, Father            Current Outpatient Medications   Medication Sig Dispense Refill    AJOVY AUTOINJECTOR 225 mg/1.5 mL Subcutaneous Auto-Injector Inject 1.5 mL (225 mg total) under the skin Every 30 days      anastrozole (ARIMIDEX) 1 mg Oral Tablet Take 1 Tablet (1 mg total) by mouth Once a day 90 Tablet 1  aspirin (ECOTRIN) 81 mg Oral Tablet, Delayed Release (E.C.) Take 1 Tablet (81 mg total) by mouth      atorvastatin (LIPITOR) 40 mg Oral Tablet Take 1 Tablet (40 mg total) by mouth Every evening 90 Tablet 3    clobetasoL (TEMOVATE) 0.05 % Cream Apply topically      Clobetasol 0.05 % Gel       famotidine (PEPCID) 40 mg Oral Tablet Take 1 Tablet (40 mg total) by mouth Every evening 90 Tablet 3    ketoconazole (NIZORAL) 2 % Cream Apply topically      latanoprost (XALATAN) 0.005 % Ophthalmic Drops       losartan (COZAAR) 50 mg Oral Tablet Take 1 Tablet (50 mg total) by mouth Once a day 90 Tablet 4    meloxicam (MOBIC) 7.5 mg Oral Tablet Take 1 Tablet (7.5 mg total) by mouth      omeprazole (PRILOSEC) 20 mg Oral Capsule, Delayed Release(E.C.) Take 1 Capsule (20 mg total) by mouth Once a day 90 Capsule 3    sennosides-docusate sodium (SENOKOT-S) 8.6-50 mg Oral Tablet Take 2 Tablets by mouth Every night      sucralfate (CARAFATE) 1 gram Oral Tablet Take 1 Tablet (1 g total) by mouth Four times a day - before meals and bedtime 60 Tablet 2    SYNTHROID 25 mcg Oral Tablet Take 1 Tablet (25 mcg total) by mouth Every morning 90 Tablet 3    ubrogepant (UBRELVY) 100 mg Oral Tablet Take by mouth One tab every 2 hr prn migraine      zolpidem (AMBIEN) 10 mg Oral Tablet Take 1 Tablet (10 mg total) by mouth Every night as needed for Insomnia 90 Tablet 1     No current facility-administered medications for this visit.     Allergies   Allergen Reactions    Hydrocodone-Acetaminophen Itching and Nausea/ Vomiting      headache  headache      Sulfa (Sulfonamides) Rash    Duloxetine Mental Status Effect    Erythromycin Nausea/ Vomiting     Social History     Socioeconomic History    Marital status: Married     Spouse name: Not on file    Number of children: 2    Years of education: Not on file    Highest education level: Not on file   Occupational History    Not on file   Tobacco Use    Smoking status: Never    Smokeless tobacco: Never   Vaping Use    Vaping Use: Never used   Substance and Sexual Activity    Alcohol use: Never    Drug use: Never    Sexual activity: Not Currently     Partners: Male   Other Topics Concern    Not on file   Social History Narrative    Not on file     Social Determinants of Health     Financial Resource Strain: Not on file   Transportation Needs: Not on file   Social Connections: Not on file   Intimate Partner Violence: Not on file   Housing Stability: Not on file       ============================================================================================================================================  GENERAL EXAMINATION  BP (!) 153/78 (Site: Right, Patient Position: Sitting)   Pulse 89   Temp 36.7 C (98 F) (Temporal)   Ht 1.676 m ('5\' 6"'$ )   Wt 70.3 kg (155 lb)   SpO2 95%   BMI 25.02 kg/m       Vital signs  personally reviewed  General: No acute distress, alert  HEENT: Normocephalic, no scleral icterus  Pulmonary: No accessory muscle use, no tachypnea  Cardiovascular: Heart with regular rate & rhythm  Extremities: No significant edema, No cyanosis    NEUROLOGIC EXAM  On neurological exam, patient was awake, alert and answering questions appropriately  Speech was fluent, without dysarthria or aphasia.    CN  II: not directly tested, grossly intact  III, IV, VI: extraocular movements intact without nystagmus  V: intact to light touch  VII: face symmetric without weakness  VIII: grossly intact  IX, X: symmetric palatal elevation  XI: normal strength of trapezius and sternocleidomastoid  bilaterally  XII: tongue midline with full movements    MOTOR  Bulk: normal  Abnormal Movements: none    Strength:     MRC Grading Scale   Right Left   Deltoid 5 5   Biceps 5 5   Triceps 5 5   Wrist Extension 5 5   Wrist Flexion - -   Finger Extension - -   Finger Abduction - -   Finger Flexion - -   Hip Flexion 5 5   Hip Extension - -   Hip Abduction - -   Hip Adduction - -   Knee Extension 5 5   Knee Flexion 5 5   Ankle Dorsiflexion 5 5   Ankle Plantarflexion 5 5   Toe Extension - -   Toe Flexion - -     REFLEXES   Right Left   Biceps 3 3   Triceps 2 2   Brachioradialis 2 2   Patellar 2 (brisk) 2 (brisk)   Achilles 2 2   Plantar - -   Hoffman Negative Positive   Pectoralis - -   Jaw Jerk - -       SENSORY  Light touch:  Intact throughout    GAIT  General: casual, normal gait    COORDINATION  Finger nose finger: normal    ================================================================================================================================LABS  Personal Review of prior labs is notable for:   2023  TSH within normal limits   A1c 6.1   CMP largely within normal limits   CBC largely within normal limits  IMAGING  Personal Review of imaging is notable for:   MRI of the brain without contrast July 03, 2022  Scattered FLAIR hyperintensities consistent with chronic white matter disease, relatively well-preserved volume for age    OTHER DIAGNOSTICS  Personal Review of other prior diagnostics is notable for:  Not applicable

## 2022-10-03 ENCOUNTER — Telehealth (INDEPENDENT_AMBULATORY_CARE_PROVIDER_SITE_OTHER): Payer: Self-pay

## 2022-10-03 MED ORDER — TIZANIDINE 2 MG TABLET
2.0000 mg | ORAL_TABLET | Freq: Three times a day (TID) | ORAL | 0 refills | Status: AC | PRN
Start: 2022-10-03 — End: 2022-11-02

## 2022-10-03 MED ORDER — CYCLOBENZAPRINE 5 MG TABLET
5.0000 mg | ORAL_TABLET | Freq: Three times a day (TID) | ORAL | 0 refills | Status: DC | PRN
Start: 2022-10-03 — End: 2022-10-03

## 2022-10-03 NOTE — Nursing Note (Signed)
Pt called stating that you had mentioned giving her flexeril during her visit. She said that she had told you that she was already taking one (robaxin '750mg'$ ). This is an old rx and she is wanting the flexeril to be called in.

## 2022-10-03 NOTE — Telephone Encounter (Signed)
Patient no longer on methocarbamol. I do believe there is a cervicogenic component to her headache so we will trial a low dose of flexeril p.r.n. and assess response. 1 month supply given

## 2022-10-03 NOTE — Telephone Encounter (Signed)
Flexeril not covered by insurance. Tizanidine is covered which it does not appear patient has ever been on. Will opt for this medication instead. Will start with low dose given concurrent used of famotidine which may potentiate effects.

## 2022-10-03 NOTE — Addendum Note (Signed)
Addended byRobyn Haber on: 10/03/2022 02:08 PM     Modules accepted: Orders

## 2022-12-07 ENCOUNTER — Ambulatory Visit (HOSPITAL_BASED_OUTPATIENT_CLINIC_OR_DEPARTMENT_OTHER): Payer: Self-pay

## 2022-12-13 ENCOUNTER — Encounter (RURAL_HEALTH_CENTER): Payer: Self-pay | Admitting: Family Medicine

## 2022-12-13 ENCOUNTER — Ambulatory Visit (RURAL_HEALTH_CENTER): Payer: Medicare PPO | Attending: Family Medicine | Admitting: Family Medicine

## 2022-12-13 ENCOUNTER — Other Ambulatory Visit: Payer: Self-pay

## 2022-12-13 ENCOUNTER — Ambulatory Visit: Payer: Medicare PPO | Attending: Family Medicine | Admitting: Family Medicine

## 2022-12-13 VITALS — BP 164/96 | HR 103 | Temp 99.2°F | Ht 66.0 in | Wt 155.0 lb

## 2022-12-13 DIAGNOSIS — I10 Essential (primary) hypertension: Secondary | ICD-10-CM | POA: Insufficient documentation

## 2022-12-13 DIAGNOSIS — Z170 Estrogen receptor positive status [ER+]: Secondary | ICD-10-CM | POA: Insufficient documentation

## 2022-12-13 DIAGNOSIS — E119 Type 2 diabetes mellitus without complications: Secondary | ICD-10-CM | POA: Insufficient documentation

## 2022-12-13 DIAGNOSIS — C50412 Malignant neoplasm of upper-outer quadrant of left female breast: Secondary | ICD-10-CM | POA: Insufficient documentation

## 2022-12-13 DIAGNOSIS — F5101 Primary insomnia: Secondary | ICD-10-CM | POA: Insufficient documentation

## 2022-12-13 DIAGNOSIS — E559 Vitamin D deficiency, unspecified: Secondary | ICD-10-CM | POA: Insufficient documentation

## 2022-12-13 DIAGNOSIS — R110 Nausea: Secondary | ICD-10-CM | POA: Insufficient documentation

## 2022-12-13 DIAGNOSIS — J3489 Other specified disorders of nose and nasal sinuses: Secondary | ICD-10-CM | POA: Insufficient documentation

## 2022-12-13 DIAGNOSIS — G500 Trigeminal neuralgia: Secondary | ICD-10-CM | POA: Insufficient documentation

## 2022-12-13 DIAGNOSIS — E785 Hyperlipidemia, unspecified: Secondary | ICD-10-CM | POA: Insufficient documentation

## 2022-12-13 DIAGNOSIS — R5383 Other fatigue: Secondary | ICD-10-CM | POA: Insufficient documentation

## 2022-12-13 DIAGNOSIS — M2550 Pain in unspecified joint: Secondary | ICD-10-CM | POA: Insufficient documentation

## 2022-12-13 DIAGNOSIS — J069 Acute upper respiratory infection, unspecified: Secondary | ICD-10-CM | POA: Insufficient documentation

## 2022-12-13 DIAGNOSIS — E039 Hypothyroidism, unspecified: Secondary | ICD-10-CM | POA: Insufficient documentation

## 2022-12-13 DIAGNOSIS — R051 Acute cough: Secondary | ICD-10-CM | POA: Insufficient documentation

## 2022-12-13 DIAGNOSIS — K219 Gastro-esophageal reflux disease without esophagitis: Secondary | ICD-10-CM | POA: Insufficient documentation

## 2022-12-13 DIAGNOSIS — G43E09 Chronic migraine with aura, not intractable, without status migrainosus: Secondary | ICD-10-CM | POA: Insufficient documentation

## 2022-12-13 LAB — CBC WITH DIFF
BASOPHIL #: 0 10*3/uL (ref 0.00–0.10)
BASOPHIL %: 0 % (ref 0–1)
EOSINOPHIL #: 0.2 10*3/uL (ref 0.00–0.50)
EOSINOPHIL %: 3 %
HCT: 37.8 % (ref 31.2–41.9)
HGB: 13.3 g/dL (ref 10.9–14.3)
LYMPHOCYTE #: 1 10*3/uL (ref 1.00–3.00)
LYMPHOCYTE %: 15 % — ABNORMAL LOW (ref 16–44)
MCH: 33.4 pg — ABNORMAL HIGH (ref 24.7–32.8)
MCHC: 35.1 g/dL (ref 32.3–35.6)
MCV: 95.1 fL (ref 75.5–95.3)
MONOCYTE #: 0.8 10*3/uL (ref 0.30–1.00)
MONOCYTE %: 12 % (ref 5–13)
MPV: 8.2 fL (ref 7.9–10.8)
NEUTROPHIL #: 4.8 10*3/uL (ref 1.85–7.80)
NEUTROPHIL %: 71 % (ref 43–77)
PLATELETS: 184 10*3/uL (ref 140–440)
RBC: 3.97 10*6/uL (ref 3.63–4.92)
RDW: 12.9 % (ref 12.3–17.7)
WBC: 6.8 10*3/uL (ref 3.8–11.8)

## 2022-12-13 LAB — MICROALBUMIN/CREATININE RATIO, URINE, RANDOM
CREATININE RANDOM URINE: 31 mg/dL — ABNORMAL HIGH (ref 11–26)
MICROALBUMIN RANDOM URINE: 0.8 mg/dL
MICROALBUMIN/CREATININE RATIO RANDOM URINE: 25.8 mg/g

## 2022-12-13 LAB — COMPREHENSIVE METABOLIC PANEL, NON-FASTING
ALBUMIN/GLOBULIN RATIO: 1.4 (ref 0.8–1.4)
ALBUMIN: 4.3 g/dL (ref 3.5–5.7)
ALKALINE PHOSPHATASE: 72 U/L (ref 34–104)
ALT (SGPT): 17 U/L (ref 7–52)
ANION GAP: 7 mmol/L (ref 4–13)
AST (SGOT): 18 U/L (ref 13–39)
BILIRUBIN TOTAL: 0.6 mg/dL (ref 0.3–1.2)
BUN/CREA RATIO: 13 (ref 6–22)
BUN: 12 mg/dL (ref 7–25)
CALCIUM, CORRECTED: 9.6 mg/dL (ref 8.9–10.8)
CALCIUM: 9.8 mg/dL (ref 8.6–10.3)
CHLORIDE: 103 mmol/L (ref 98–107)
CO2 TOTAL: 27 mmol/L (ref 21–31)
CREATININE: 0.91 mg/dL (ref 0.60–1.30)
ESTIMATED GFR: 66 mL/min/{1.73_m2} (ref >59)
GLOBULIN: 3.1 (ref 2.9–5.4)
GLUCOSE: 159 mg/dL — ABNORMAL HIGH (ref 74–109)
OSMOLALITY, CALCULATED: 277 mOsm/kg (ref 270–290)
POTASSIUM: 3.6 mmol/L (ref 3.5–5.1)
PROTEIN TOTAL: 7.4 g/dL (ref 6.4–8.9)
SODIUM: 137 mmol/L (ref 136–145)

## 2022-12-13 LAB — COVID-19, FLU A/B, RSV RAPID BY PCR
INFLUENZA VIRUS TYPE A: NOT DETECTED
INFLUENZA VIRUS TYPE B: NOT DETECTED
RESPIRATORY SYNCTIAL VIRUS (RSV): NOT DETECTED
SARS-CoV-2: NOT DETECTED

## 2022-12-13 LAB — LIPID PANEL
CHOL/HDL RATIO: 2.8
CHOLESTEROL: 134 mg/dL (ref <200)
HDL CHOL: 48 mg/dL (ref 23–92)
LDL CALC: 60 mg/dL (ref 0–100)
TRIGLYCERIDES: 131 mg/dL (ref <=150)
VLDL CALC: 26 mg/dL (ref 0–50)

## 2022-12-13 LAB — VITAMIN D 25 TOTAL: VITAMIN D: 25 ng/mL — ABNORMAL LOW (ref 30–100)

## 2022-12-13 LAB — MAGNESIUM: MAGNESIUM: 2 mg/dL (ref 1.9–2.7)

## 2022-12-13 LAB — THYROID STIMULATING HORMONE (SENSITIVE TSH): TSH: 2.213 u[IU]/mL (ref 0.450–5.330)

## 2022-12-13 MED ORDER — SUCRALFATE 1 GRAM TABLET
1.0000 g | ORAL_TABLET | Freq: Four times a day (QID) | ORAL | 2 refills | Status: DC
Start: 2022-12-13 — End: 2023-12-17

## 2022-12-13 MED ORDER — OMEPRAZOLE 20 MG CAPSULE,DELAYED RELEASE
20.0000 mg | DELAYED_RELEASE_CAPSULE | Freq: Every day | ORAL | 3 refills | Status: DC
Start: 2022-12-13 — End: 2023-06-13

## 2022-12-13 MED ORDER — ATORVASTATIN 40 MG TABLET
40.0000 mg | ORAL_TABLET | Freq: Every evening | ORAL | 3 refills | Status: DC
Start: 2022-12-13 — End: 2023-06-13

## 2022-12-13 MED ORDER — FAMOTIDINE 40 MG TABLET
40.0000 mg | ORAL_TABLET | Freq: Every evening | ORAL | 3 refills | Status: DC
Start: 2022-12-13 — End: 2023-06-13

## 2022-12-13 MED ORDER — AMLODIPINE 5 MG-VALSARTAN 160 MG TABLET
1.0000 | ORAL_TABLET | Freq: Every day | ORAL | 3 refills | Status: DC
Start: 2022-12-13 — End: 2023-03-23

## 2022-12-13 MED ORDER — SYNTHROID 25 MCG TABLET
25.0000 ug | ORAL_TABLET | Freq: Every morning | ORAL | 3 refills | Status: DC
Start: 2022-12-13 — End: 2023-10-31

## 2022-12-13 MED ORDER — SENNOSIDES 8.6 MG-DOCUSATE SODIUM 50 MG TABLET
2.0000 | ORAL_TABLET | Freq: Every evening | ORAL | 3 refills | Status: DC
Start: 2022-12-13 — End: 2023-06-13

## 2022-12-13 MED ORDER — MELOXICAM 7.5 MG TABLET
7.5000 mg | ORAL_TABLET | Freq: Every day | ORAL | 3 refills | Status: DC
Start: 2022-12-13 — End: 2023-06-13

## 2022-12-13 MED ORDER — ZOLPIDEM 10 MG TABLET
10.0000 mg | ORAL_TABLET | Freq: Every evening | ORAL | 1 refills | Status: DC | PRN
Start: 2022-12-13 — End: 2023-06-13

## 2022-12-13 NOTE — Progress Notes (Signed)
FAMILY MEDICINE, [place]  [place] [address]  Operated by [facility]     Name: Kari Gordon MRN:  [id number]   Date of Birth: 1946/06/18 Age: 77 y.o.   Date: 12/13/2022  Time: 09:48     Provider: Feliberto Harts, DO    Assessment/Plan:    Fatigue, unspecified type  rhinorrhea  Acute cough  Nausea  Acute upper respiratory infection, unspecified  Await 4 plex results.  Sx treatment for now    Type 2 diabetes, diet controlled (CMS HCC)  Last Hgb 6.1    Acquired Hypothyroidism  Continue synthroid. Recheck TSH. Last level 1.865.    Right trigeminal neuralgia  S/p surgery with sx improvement.     Primary Insomnia  Using Ambien with sleep onset and maintenance. Tried lower dose but it did not work    GERD, unspecified whether esophagitis present(gastroesophageal reflux disease)  Controlled on meds    Malignant neoplasm of upper-outer quadrant of left breast in female, estrogen receptor positive (CMS [place])  Has been evaluated with Dr. Mallie Mussel and could not find he original abnormality seen. Has left breast pain. Going for repeat in July    Hyperlipidemia LDL goal <70  Continue Lipitor; recheck lab    HTN, unspecified type  Not well controlled. Stop cozaar. Gave amlodipine/valsartan instead    Chronic Migraine with aura without status migrainosus, not intractable  Using ajovy. Following with Dr. Tobie Poet.  Thinking about botox. Getting Physical therapy on her neck which helps     Vitamin d def    Arthralgia, unspecified joint     Reason for visit: Follow Up (Cdm follow up)      History of Present Illness:  Kari Gordon is a 77 y.o. female  with GERD, hyperlipidemia, hypertension, osteoarthritis, sleep apnea, and vitamin D deficiency who presents for a six month follow-up.     BP is not as well controlled since stopping metoprolol.     Has not felt good for two days. No fever but has cough/sore throat. Increased rhinorrhea. Taking allergy shots  without good control of allergies. Yesterday started with increased joint pains. Fingers/toes all hurt. Wearing two sets of pants/socks due to cold.    Historical Data    Past Medical History:  Past Medical History:   Diagnosis Date    Alopecia 03/22/2022    Cancer (CMS [place])     Diabetes mellitus, type 2 (CMS HCC)     Dysuria 03/22/2022    Esophageal reflux     Essential hypertension     Fibromyalgia     Herpes zoster 03/22/2022    Hx of breast cancer     Insomnia 03/22/2022    Nerve damage     Osteoarthritis     Sleep apnea     Type 2 diabetes, diet controlled (CMS [place]) 03/22/2022    Unspecified glaucoma(365.9)     Uterine cancer (CMS HCC)     UTI (urinary tract infection) 03/22/2022         Past Surgical History:  Past Surgical History:   Procedure Laterality Date    COLONOSCOPY      dr Mallie Mussel 04/25/22 diverticular disease    EYE SURGERY      cornea and cataract surgery 12/29/21 left 05/04/22 right    HX BREAST LUMPECTOMY      HX CHOLECYSTECTOMY      HX HEART CATHETERIZATION      HX HYSTERECTOMY  LYMPH NODE BIOPSY      NERVE SURGERY      NOSE SURGERY      UTERINE FIBROID SURGERY           Allergies:  Allergies   Allergen Reactions    Hydrocodone-Acetaminophen Itching and Nausea/ Vomiting     headache  headache      Sulfa (Sulfonamides) Rash    Duloxetine Mental Status Effect    Erythromycin Nausea/ Vomiting     Medications:  AJOVY AUTOINJECTOR 225 mg/1.5 mL Subcutaneous Auto-Injector, Inject 1.5 mL (225 mg total) under the skin Every 30 days  anastrozole (ARIMIDEX) 1 mg Oral Tablet, Take 1 Tablet (1 mg total) by mouth Once a day  aspirin (ECOTRIN) 81 mg Oral Tablet, Delayed Release (E.C.), Take 1 Tablet (81 mg total) by mouth  atorvastatin (LIPITOR) 40 mg Oral Tablet, Take 1 Tablet (40 mg total) by mouth Every evening  clobetasoL (TEMOVATE) 0.05 % Cream, Apply topically  famotidine (PEPCID) 40 mg Oral Tablet, Take 1 Tablet (40 mg total) by mouth Every evening  ketoconazole (NIZORAL) 2 % Cream, Apply  topically  latanoprost (XALATAN) 0.005 % Ophthalmic Drops,   losartan (COZAAR) 50 mg Oral Tablet, Take 1 Tablet (50 mg total) by mouth Once a day  meloxicam (MOBIC) 7.5 mg Oral Tablet, Take 1 Tablet (7.5 mg total) by mouth  omeprazole (PRILOSEC) 20 mg Oral Capsule, Delayed Release(E.C.), Take 1 Capsule (20 mg total) by mouth Once a day  sennosides-docusate sodium (SENOKOT-S) 8.6-50 mg Oral Tablet, Take 2 Tablets by mouth Every night  sucralfate (CARAFATE) 1 gram Oral Tablet, Take 1 Tablet (1 g total) by mouth Four times a day - before meals and bedtime  SYNTHROID 25 mcg Oral Tablet, Take 1 Tablet (25 mcg total) by mouth Every morning  ubrogepant (UBRELVY) 100 mg Oral Tablet, Take by mouth One tab every 2 hr prn migraine  zolpidem (AMBIEN) 10 mg Oral Tablet, Take 1 Tablet (10 mg total) by mouth Every night as needed for Insomnia  Clobetasol 0.05 % Gel,     No facility-administered medications prior to visit.     Family History:  Family Medical History:       Problem Relation (Age of Onset)    Glaucoma Brother    Heart Disease Mother, Father    Hypertension (High Blood Pressure) Mother, Father    Stroke Mother, Father            Social History:  Social History     Socioeconomic History    Marital status: Married    Number of children: 2   Tobacco Use    Smoking status: Never    Smokeless tobacco: Never   Vaping Use    Vaping Use: Never used   Substance and Sexual Activity    Alcohol use: Never    Drug use: Never    Sexual activity: Not Currently     Partners: Male           Review of Systems:  Any pertinent Review of Systems as addressed in the HPI above.    Physical Exam:  Vital Signs:  Vitals:    12/13/22 0929 12/13/22 0937   BP: (!) 150/96 (!) 164/96   Pulse: (!) 103    Temp: 37.3 C (99.2 F)    TempSrc: Tympanic    SpO2: 97%    Weight: 70.3 kg (155 lb)    Height: 1.676 m ('5\' 6"'$ )    BMI: 25.07      Physical Exam  General: cooperative, healthy appearing, no acute distress and alert  Orientation/Consciousness:  patient oriented x3  HENMT  Ears: hearing grossly normal bilaterally, TM normal on the right and TM normal on the left  Mouth: oropharynx injected. No exudates. Nares injected with minimal edema  Eyes  Sclera: sclerae injected.  Neck  Neck: no lymphadenopathy  Resp  Effort & Inspection: normal respiratory effort  Auscultation: clear to auscultation bilaterally  Cardio  Jugular venous pressure: no JVD  Rate: elevated rate  Rhythm: regular rhythm  Heart Sounds: S1 normal, S2 normal and no rubs  Bruits: no carotid bruits  Pulses: normal peripheral pulses  GI  Palpation: soft and no hepatosplenomegaly  Skin  Lesions: no lesions  Rashes: no rashes  Neuro  General: patient oriented x3, tone normal and moves all extremities  Cognition: normal cognition  Extrem  General: no clubbing, no cyanosis and no edema  Psych  Mental Status: mental status grossly normal  Speech and Movement: speech and movement normal  Mood: congruent mood  Affect: normal affect  Attitude: cooperative      [place], DO     Portions of this note may be dictated using voice recognition software or a dictation service. Variances in spelling and vocabulary are possible and unintentional. Not all errors are caught/corrected. Please notify the [facility] if any discrepancies are noted or if the meaning of any statement is not clear.

## 2022-12-14 ENCOUNTER — Encounter (RURAL_HEALTH_CENTER): Payer: Self-pay | Admitting: Family Medicine

## 2022-12-14 ENCOUNTER — Telehealth (RURAL_HEALTH_CENTER): Payer: Self-pay | Admitting: Family Medicine

## 2022-12-14 LAB — HGA1C (HEMOGLOBIN A1C WITH EST AVG GLUCOSE): HEMOGLOBIN A1C: 6.2 % — ABNORMAL HIGH (ref 4.0–6.0)

## 2022-12-14 NOTE — Telephone Encounter (Unsigned)
Patient called she states you started new BP medication and sent to her mail order however she wanted to know if she should have some for now to local pharmacy please advise. Note she is informed 4 plex negative/br

## 2022-12-15 LAB — CYCLIC CITRULLINATED PEPTIDE ANTIBODIES, IGG, SERUM
CYCLIC CITRULLINATED PEPTIDE ANTIBODY IGG QUAL: NEGATIVE
CYCLIC CITRULLINATED PEPTIDE ANTIBODY IGG QUANT: <0.5 U/mL (ref <3.0)

## 2022-12-18 LAB — HEP-2 SUBSTRATE ANTINUCLEAR ANTIBODIES (ANA), SERUM: ANA INTERPRETATION: NEGATIVE

## 2022-12-26 ENCOUNTER — Other Ambulatory Visit (RURAL_HEALTH_CENTER): Payer: Self-pay | Admitting: Family Medicine

## 2023-01-02 ENCOUNTER — Encounter (INDEPENDENT_AMBULATORY_CARE_PROVIDER_SITE_OTHER): Payer: Self-pay | Admitting: NEUROLOGY

## 2023-01-02 ENCOUNTER — Other Ambulatory Visit: Payer: Self-pay

## 2023-01-02 ENCOUNTER — Ambulatory Visit (INDEPENDENT_AMBULATORY_CARE_PROVIDER_SITE_OTHER): Payer: Medicare PPO | Admitting: NEUROLOGY

## 2023-01-02 VITALS — BP 136/70 | HR 97 | Temp 97.9°F | Wt 154.6 lb

## 2023-01-02 DIAGNOSIS — G43701 Chronic migraine without aura, not intractable, with status migrainosus: Secondary | ICD-10-CM

## 2023-01-02 NOTE — Progress Notes (Signed)
ASSESSMENT  MIGRAINE WITHOUT AURA:  She is a 77 year old woman who follows-up for longstanding history of headaches.  Headaches meet criteria for migraine.  They are unilateral, associated with photo and phonophobia, nausea and worsened with activity.  She currently has 12-15 headache days per month since being on Ajovy with Ubrelvy as abortive.  MRI of the brain is largely unremarkable.  Neurologic exam today is nonfocal.  She has this point failed multiple classes of medications including beta-blockers, ACE inhibitors, SNRI, antiepileptics and CGRP antagonists.  Ajovy has improved her frequency and severity and Roselyn Meier is helpful if she can take it and rest.  We again discussed medication overuse headache, though she has cut back on the use of Tylenol.  We will try to limit the use of this or any other abortive medications to no more than 2-3 times per week.  Though she is not ideally controlled, she is overall happy with response and would like to continue current regimen.   DIZZINESS:  I believe this is likely related to 1 as workup and treatment has otherwise not lead to an alternative diagnosis.  We will continue to monitor.    PLAN  1. Continue Ajovy injections monthly      Continue Ubrelvy p.r.n.      Counseled on medication overuse headache      Encouraged aggressive hydration      Encouraged her to follow-up with physical therapy  2. Continue to monitor    RTC 6 months    Thank you for allowing me to participate in your patient's care and please do not hesitate to contact me for any questions or concerns.    Adrian Prows, DO  Assistant Professor of Neurology  [facility]     I personally spent a total of 22 minutes today preparing to see the patient, in the encounter with the patient, and documenting after the visit.    ==========================================================================================================================================    NAME:   Kari Gordon  DOB:  Mar 04, 1946  VISIT DATE:  09/29/2022    CC:  Migraine    Patient seen in consultation at the request of Dr. Feliberto Harts  History obtained from the patient and chart/records  Age of patient:  77 y.o.    INTERVAL: Since last visit, she has been doing relatively well., She is having about 2-3 headaches per week though these are generally mild and responsive to [place]. She believes physical therapy has been helpful in controlling her headaches as well. She does think that stress has been a big contributor. She is tolerating medications well. Overall happy with current control.     HPI:   I had the pleasure of seeing your patient in neurology clinic for an outpatient consultation, who is a 77 y.o. year old female who was referred for evaluation of trigeminal neuralgia.  Please allow me to summarize the history for the record.    She states she has a longstanding history of migraine dating back to childhood.  She states that they are characterized by right neck pain that radiate into the temporal.  At times she can have headaches that begin between the eyes.  They are associated with photo and phonophobia as well as nausea.  They are worsened with activity.  She does have sensitivity to smells as well.  These headaches can last days.  She typically has more mild headaches on a daily basis and then we will have 2-3 incapacitating headaches per month that last  multiple days.  She is taking Tylenol on a daily basis.  Currently she is on Ajovy and has been for the past 3 months and finds it somewhat helpful in that she will get some headache-free days for a few days following a dose.  She is using [place] as an abortive treatment.  This has been quite helpful and she can often curtail the stronger headaches with this medication.  She is currently reserving the use of this for most severe headaches.  She has seen multiple    Previous medications:  Cymbalta  Irbesartan  Aimovig - initially effective,  then lost efficacy  Topamax - ineffective  Nurtec - ineffective  Metoprolol - ineffective    Current medications:   Melodie Bouillon    Of note she does have a history of right trigeminal neuralgia.  This started in 2019 or 2020.  Angiography revealed contact with the nerve by nearby vessel.  She was referred to [facility] and underwent neurosurgical procedure to ameliorate her symptoms.  This was successful and she is not had symptoms since the procedure.  Had trigeminal neuralgia w neurologist to this point.  hich started in 2019 or 2020. Used medications which were not helpful. Then was referred to [facility] by Dr. Candelaria Stagers and had surgery which was successful.     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    Malignant neoplasm of upper-outer quadrant of left breast in female, estrogen receptor positive (CMS HCC)     Adenocarcinoma of endometrium (CMS HCC)    Fuchs' corneal dystrophy of both eyes    GERD (gastroesophageal reflux disease)    Hypothyroidism    Lichen sclerosus et atrophicus    Right trigeminal neuralgia    Age-related nuclear cataract of both eyes    Abnormal cardiovascular stress test    UTI (urinary tract infection)    Alopecia    Insomnia    Herpes zoster    Dysuria    Type 2 diabetes, diet controlled (CMS HCC)     Past Surgical History:   Procedure Laterality Date    COLONOSCOPY      dr Mallie Mussel 04/25/22 diverticular disease    EYE SURGERY      cornea and cataract surgery 12/29/21 left 05/04/22 right    HX BREAST LUMPECTOMY      HX CHOLECYSTECTOMY      HX HEART CATHETERIZATION      HX HYSTERECTOMY      LYMPH NODE BIOPSY      NERVE SURGERY      NOSE SURGERY      UTERINE FIBROID SURGERY           Family Medical History:       Problem Relation (Age of Onset)    Glaucoma Brother    Heart  Disease Mother, Father    Hypertension (High Blood Pressure) Mother, Father    Stroke Mother, Father            Current Outpatient Medications   Medication Sig Dispense Refill    AJOVY AUTOINJECTOR 225 mg/1.5 mL Subcutaneous Auto-Injector Inject 1.5 mL (225 mg total) under the skin Every 30 days      Amlodipine-Valsartan 5-160 mg Oral Tablet Take 1 Tablet by mouth Once a day 90 Tablet 3    anastrozole (ARIMIDEX) 1 mg Oral Tablet Take 1 Tablet (1 mg total)  by mouth Once a day 90 Tablet 1    aspirin (ECOTRIN) 81 mg Oral Tablet, Delayed Release (E.C.) Take 1 Tablet (81 mg total) by mouth      atorvastatin (LIPITOR) 40 mg Oral Tablet Take 1 Tablet (40 mg total) by mouth Every evening 90 Tablet 3    clobetasoL (TEMOVATE) 0.05 % Cream Apply topically      famotidine (PEPCID) 40 mg Oral Tablet Take 1 Tablet (40 mg total) by mouth Every evening 90 Tablet 3    ketoconazole (NIZORAL) 2 % Cream Apply topically      latanoprost (XALATAN) 0.005 % Ophthalmic Drops       losartan (COZAAR) 50 mg Oral Tablet Take 1 Tablet (50 mg total) by mouth Once a day      meloxicam (MOBIC) 7.5 mg Oral Tablet Take 1 Tablet (7.5 mg total) by mouth Once a day 90 Tablet 3    montelukast (SINGULAIR) 10 mg Oral Tablet TAKE 1 TABLET AT BEDTIME 90 Tablet 3    omeprazole (PRILOSEC) 20 mg Oral Capsule, Delayed Release(E.C.) Take 1 Capsule (20 mg total) by mouth Once a day 90 Capsule 3    sennosides-docusate sodium (SENOKOT-S) 8.6-50 mg Oral Tablet Take 2 Tablets by mouth Every night 180 Tablet 3    sucralfate (CARAFATE) 1 gram Oral Tablet Take 1 Tablet (1 g total) by mouth Four times a day - before meals and bedtime 60 Tablet 2    SYNTHROID 25 mcg Oral Tablet Take 1 Tablet (25 mcg total) by mouth Every morning 90 Tablet 3    ubrogepant (UBRELVY) 100 mg Oral Tablet Take by mouth One tab every 2 hr prn migraine      zolpidem (AMBIEN) 10 mg Oral Tablet Take 1 Tablet (10 mg total) by mouth Every night as needed for Insomnia 90 Tablet 1     No current  facility-administered medications for this visit.     Allergies   Allergen Reactions    Hydrocodone-Acetaminophen Itching and Nausea/ Vomiting     headache  headache      Sulfa (Sulfonamides) Rash    Duloxetine Mental Status Effect    Erythromycin Nausea/ Vomiting     Social History     Socioeconomic History    Marital status: Married     Spouse name: Not on file    Number of children: 2    Years of education: Not on file    Highest education level: Not on file   Occupational History    Not on file   Tobacco Use    Smoking status: Never    Smokeless tobacco: Never   Vaping Use    Vaping Use: Never used   Substance and Sexual Activity    Alcohol use: Never    Drug use: Never    Sexual activity: Not Currently     Partners: Male   Other Topics Concern    Not on file   Social History Narrative    Not on file     Social Determinants of Health     Financial Resource Strain: Not on file   Transportation Needs: Not on file   Social Connections: Not on file   Intimate Partner Violence: Not on file   Housing Stability: Not on file       ============================================================================================================================================  GENERAL EXAMINATION  BP 136/70 (Site: Right, Patient Position: Sitting, Cuff Size: Adult)   Pulse 97   Temp 36.6 C (97.9 F) (Temporal)   Wt 70.1 kg (154 lb  9.6 oz)   SpO2 94%   BMI 24.95 kg/m     Vital signs personally reviewed  General: No acute distress, alert  HEENT: Normocephalic, no scleral icterus  Extremities: No significant edema, No cyanosis    NEUROLOGIC EXAM  On neurological exam, patient was awake, alert and answering questions appropriately  Speech was fluent, without dysarthria or aphasia.    CN  II-XII: grossly intact    MOTOR  Bulk: normal  Abnormal Movements: none    Strength:     MRC Grading Scale   Right Left   Deltoid 5 5   Biceps 5 5   Triceps 5 5   Wrist Extension - -   Wrist Flexion - -   Finger Extension - -   Finger  Abduction - -   Finger Flexion - -   Hip Flexion 5 5   Hip Extension - -   Hip Abduction - -   Hip Adduction - -   Knee Extension 5 5   Knee Flexion 5 5   Ankle Dorsiflexion - -   Ankle Plantarflexion - -   Toe Extension - -   Toe Flexion - -     REFLEXES   Right Left   Biceps 3 3   Triceps 2 2   Brachioradialis 2 2   Patellar 2 (brisk) 2 (brisk)   Achilles 2 2   Plantar - -   Hoffman - -   Pectoralis - -   Jaw Jerk - -       SENSORY  Light touch:  Intact throughout    GAIT  General: casual, normal gait    ================================================================================================================================LABS  Personal Review of prior labs is notable for:   2024  TSH normal  Anti CCP negative   ANA negative  CMP largely within normal limits  CBC largely within normal  2023  TSH within normal limits   A1c 6.1   CMP largely within normal limits   CBC largely within normal limits  IMAGING  Personal Review of imaging is notable for:   MRI of the brain without contrast July 03, 2022  Scattered FLAIR hyperintensities consistent with chronic white matter disease, relatively well-preserved volume for age    OTHER DIAGNOSTICS  Personal Review of other prior diagnostics is notable for:  Not applicable

## 2023-01-08 ENCOUNTER — Telehealth (RURAL_HEALTH_CENTER): Payer: Self-pay | Admitting: Family Medicine

## 2023-01-08 NOTE — Telephone Encounter (Signed)
Patient had questions about her recent lab work I pulled her letter from dr bowling and discussed with her all lab values she wanted dr bowling to know she is not on or has not taken any NSAID motrin Advil or aleve she voiced understanding/br

## 2023-02-19 ENCOUNTER — Other Ambulatory Visit (INDEPENDENT_AMBULATORY_CARE_PROVIDER_SITE_OTHER): Payer: Self-pay | Admitting: NEUROLOGY

## 2023-02-19 MED ORDER — AJOVY 225 MG/1.5 ML SUBCUTANEOUS AUTO-INJECTOR
225.0000 mg | AUTO-INJECTOR | SUBCUTANEOUS | 0 refills | Status: DC
Start: 2023-02-19 — End: 2023-04-17

## 2023-03-20 ENCOUNTER — Other Ambulatory Visit (RURAL_HEALTH_CENTER): Payer: Self-pay | Admitting: Family Medicine

## 2023-03-23 ENCOUNTER — Other Ambulatory Visit (RURAL_HEALTH_CENTER): Payer: Self-pay | Admitting: Family Medicine

## 2023-03-23 MED ORDER — AMLODIPINE 5 MG-VALSARTAN 160 MG TABLET
1.0000 | ORAL_TABLET | Freq: Every day | ORAL | 0 refills | Status: AC
Start: 2023-03-23 — End: 2023-04-22

## 2023-04-12 ENCOUNTER — Other Ambulatory Visit (RURAL_HEALTH_CENTER): Payer: Self-pay | Admitting: Family Medicine

## 2023-04-12 DIAGNOSIS — E039 Hypothyroidism, unspecified: Secondary | ICD-10-CM

## 2023-04-12 DIAGNOSIS — E559 Vitamin D deficiency, unspecified: Secondary | ICD-10-CM | POA: Insufficient documentation

## 2023-04-12 DIAGNOSIS — E119 Type 2 diabetes mellitus without complications: Secondary | ICD-10-CM

## 2023-04-12 DIAGNOSIS — G500 Trigeminal neuralgia: Secondary | ICD-10-CM

## 2023-04-12 DIAGNOSIS — Z79811 Long term (current) use of aromatase inhibitors: Secondary | ICD-10-CM

## 2023-04-12 DIAGNOSIS — N182 Chronic kidney disease, stage 2 (mild): Secondary | ICD-10-CM | POA: Insufficient documentation

## 2023-04-12 DIAGNOSIS — E785 Hyperlipidemia, unspecified: Secondary | ICD-10-CM | POA: Insufficient documentation

## 2023-04-12 DIAGNOSIS — K219 Gastro-esophageal reflux disease without esophagitis: Secondary | ICD-10-CM

## 2023-04-17 ENCOUNTER — Telehealth (INDEPENDENT_AMBULATORY_CARE_PROVIDER_SITE_OTHER): Payer: Self-pay | Admitting: NEUROLOGY

## 2023-04-17 MED ORDER — AJOVY 225 MG/1.5 ML SUBCUTANEOUS AUTO-INJECTOR
225.0000 mg | AUTO-INJECTOR | SUBCUTANEOUS | 1 refills | Status: DC
Start: 2023-04-17 — End: 2023-08-27

## 2023-05-03 IMAGING — MR MRI SHOULDER RT W/O CONTRAST
6 series · 38 of 40 positions shown · IV contrast (gadolinium)
Comparison: None available.

﻿EXAM:  37222   MRI SHOULDER RT W/O CONTRAST
INDICATION: Chronic right shoulder pain. Diminished range of motion.  No prior surgery.
TECHNIQUE: Multiplanar, multisequential MRI of the right shoulder was performed without gadolinium contrast.

[Series 6: T1 · oblique · right · 3.5mm · 0.33mm/px · 7 of 18 slices shown]
[im 1/18]
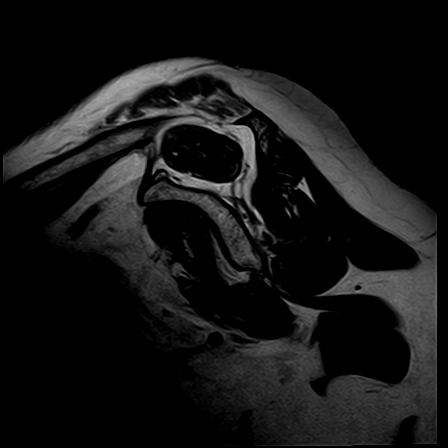
[im 3/18]
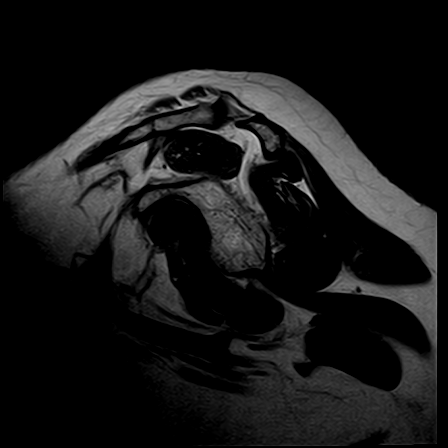
[im 6/18]
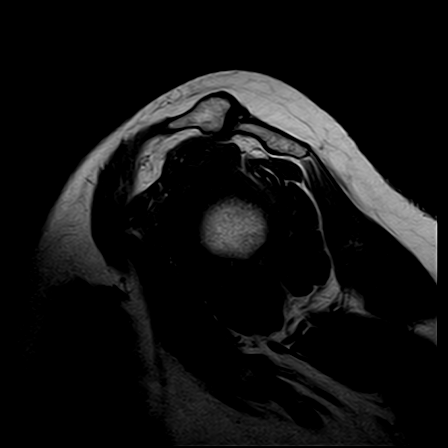
[im 9/18]
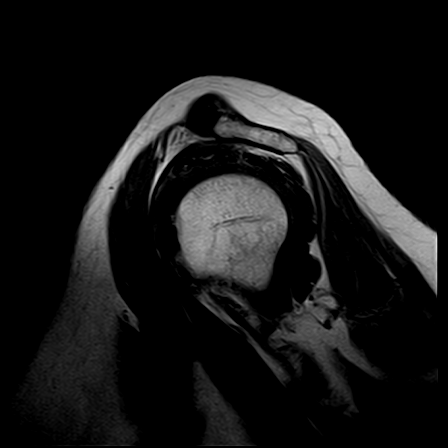
[im 12/18]
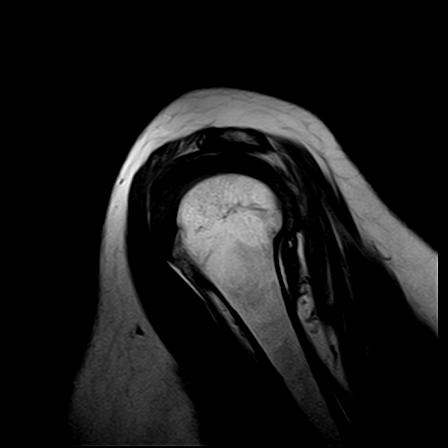
[im 15/18]
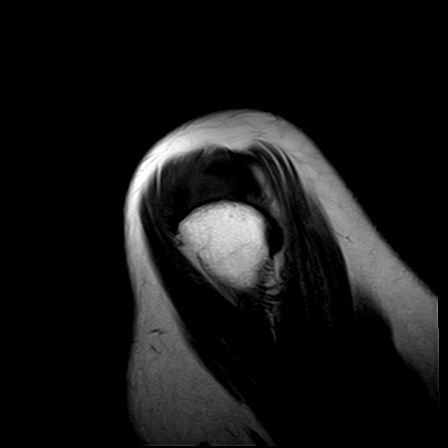
[im 18/18]
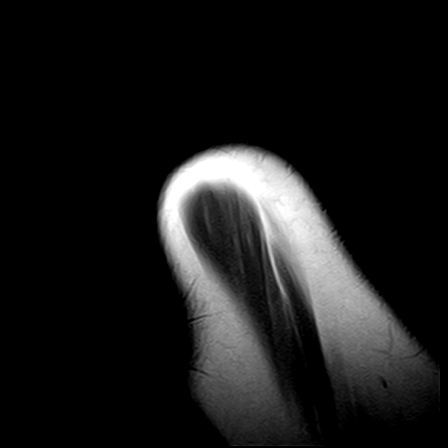

[Series 7: STIR · oblique · right · 3.5mm · 0.47mm/px · 7 of 18 slices shown (1 of 2)]
[im 1/18]
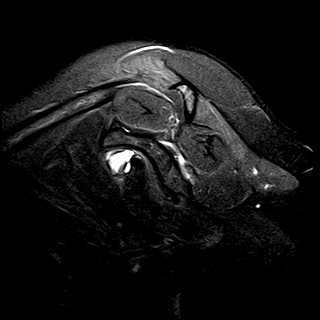
[im 3/18]
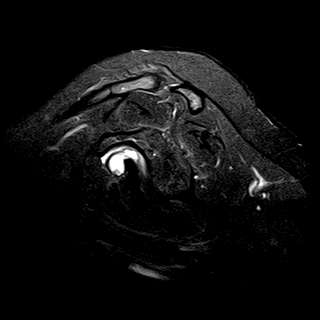
[im 6/18]
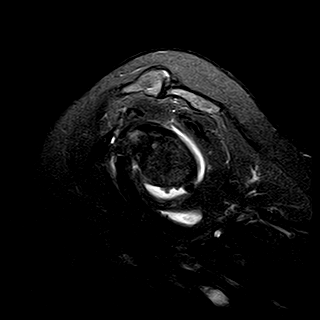
[im 9/18]
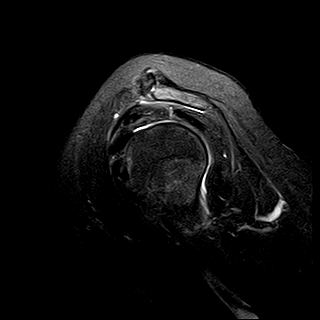
[im 12/18]
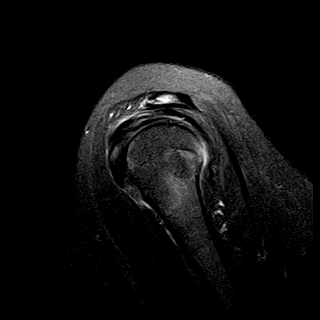
[im 15/18]
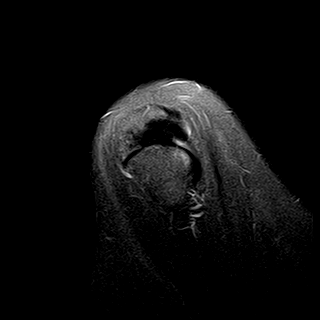
[im 18/18]
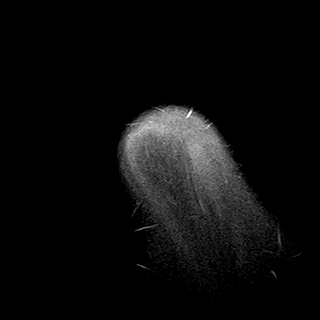

[Series 8: PD fat-sat · axial · right · 4.0mm · 0.50mm/px · z∈[-44,+33]mm · 6 of 18 slices shown (1 of 2)]
[im 1/18]
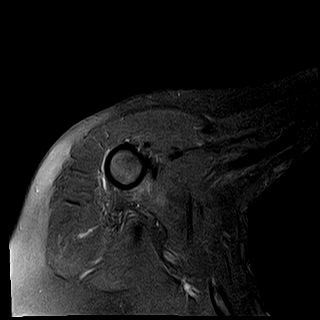
[im 4/18]
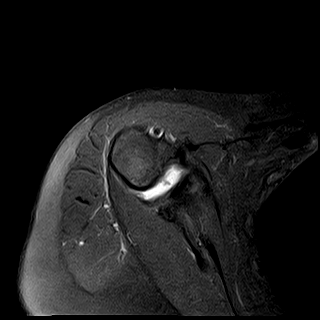
[im 7/18]
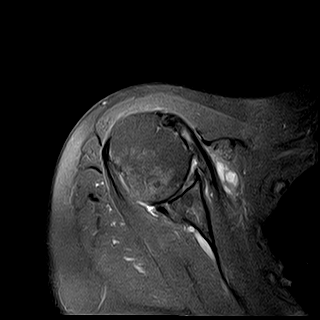
[im 11/18]
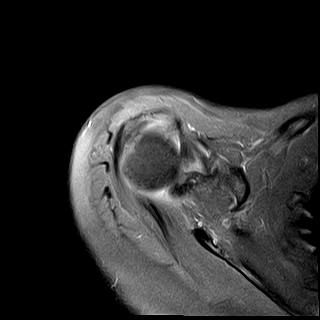
[im 14/18]
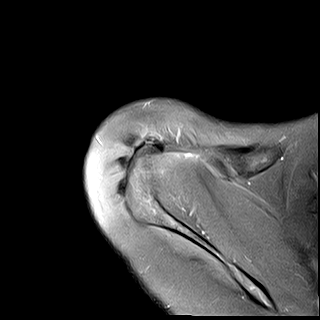
[im 18/18]
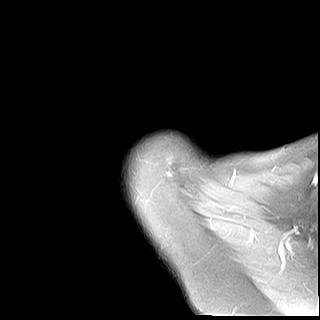

[Series 9: T2 fat-sat · axial · right · 4.0mm · 0.42mm/px · z∈[-57,+46]mm · 8 of 24 slices shown]
[im 1/24]
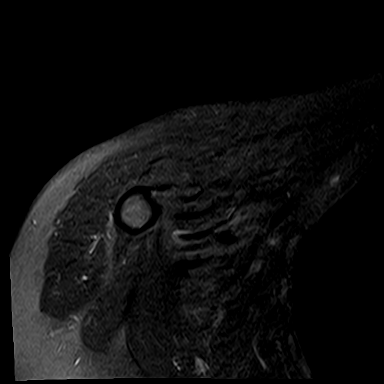
[im 4/24]
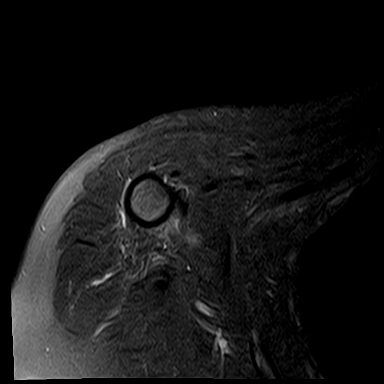
[im 7/24]
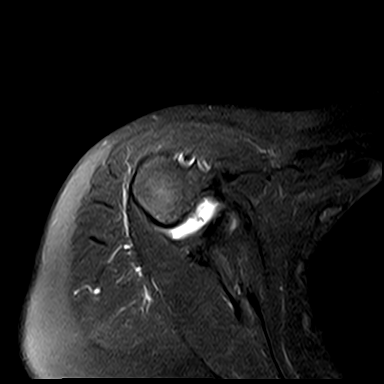
[im 10/24]
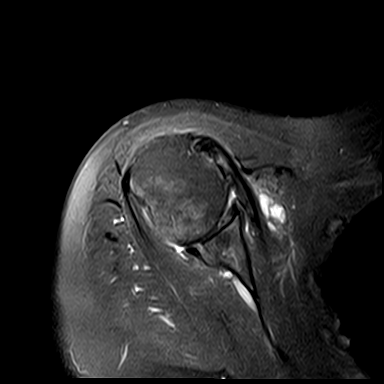
[im 14/24]
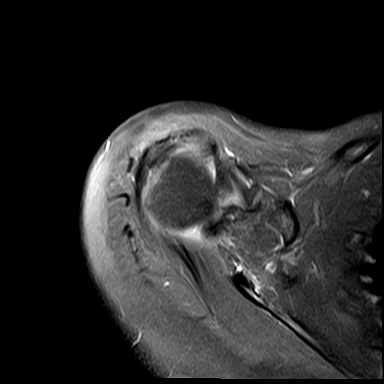
[im 17/24]
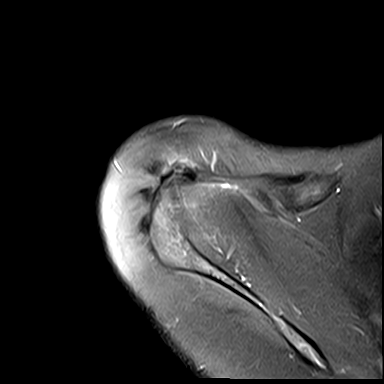
[im 20/24]
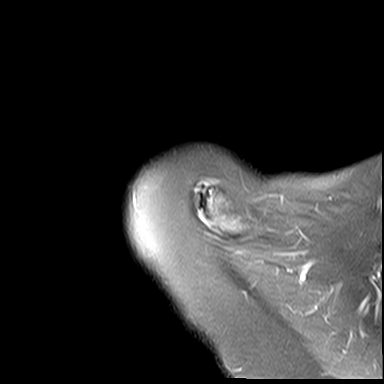
[im 24/24]
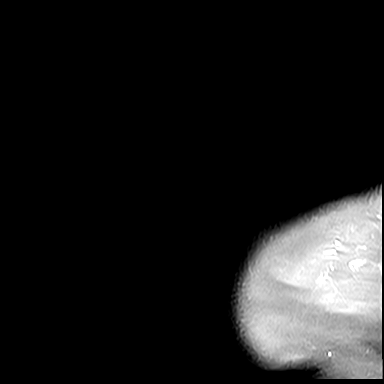

[Series 10: PD fat-sat · oblique · right · 3.5mm · 0.47mm/px · 6 of 18 slices shown (2 of 2)]
[im 1/18]
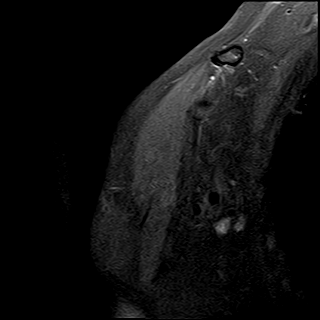
[im 4/18]
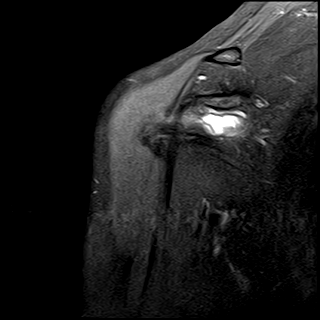
[im 7/18]
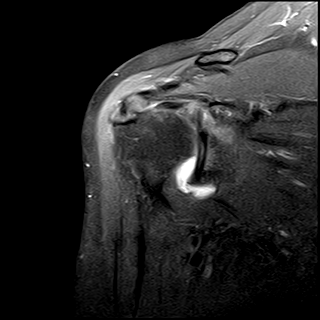
[im 11/18]
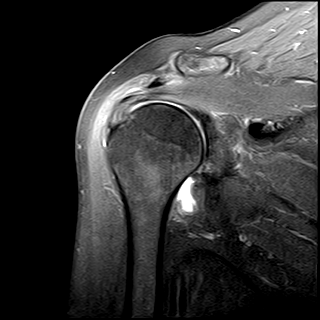
[im 14/18]
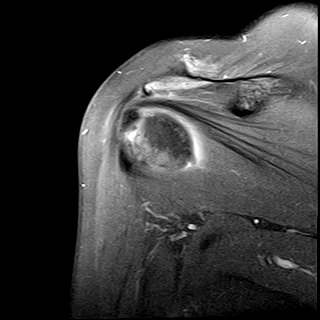
[im 18/18]
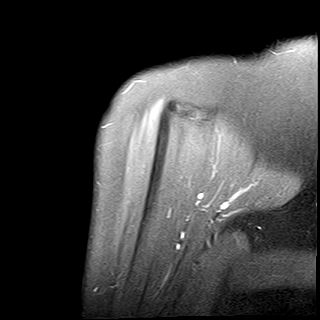

[Series 11: STIR · oblique · right · 3.5mm · 0.47mm/px · 4 of 18 slices shown (2 of 2)]
[im 1/18]
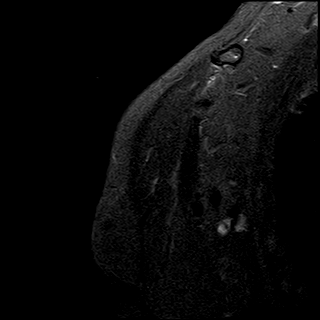
[im 4/18]
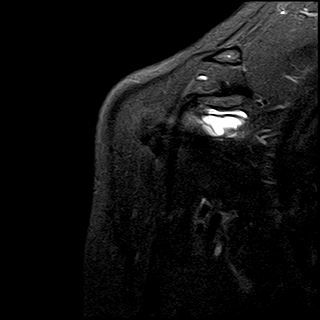
[im 7/18]
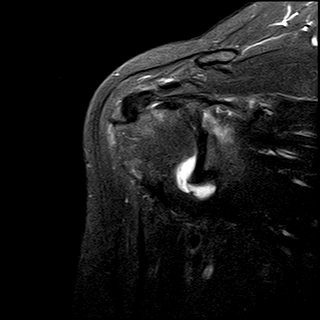
[im 11/18]
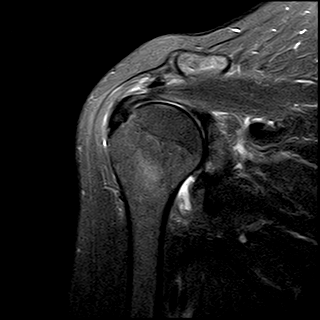

[38 of 40 positions shown; findings below may reference images not displayed]

FINDINGS: No acute bony lesions of the right shoulder are seen.

Moderate supraspinatus tendinitis with intrasubstance partial-thickness tear within the supraspinatus tendon is noted.  Inflammatory changes of distal subscapularis tendon are noted. No evidence of full-thickness tear is seen.

No significant compromise of the subacromion space is noted.  Glenoid labrum and biceps tendon are intact.  Effusion in the glenohumeral joint is noted along with a subcoracoid bursa, 2 cm in diameter.
IMPRESSION: 1. Tendinopathy of the distal supraspinatus tendon with small intrasubstance partial-thickness tear.  No evidence of full-thickness tear is seen. 

2. Tendinitis of distal subscapularis tendon.  No evidence of subscapularis tendon tear is seen.

3. Fluid in the glenohumeral joint and 2 cm cyst of the subcoracoid bursa are noted.

## 2023-05-24 ENCOUNTER — Other Ambulatory Visit: Payer: Self-pay

## 2023-05-24 ENCOUNTER — Ambulatory Visit: Payer: Medicare PPO | Attending: Family Medicine | Admitting: Family Medicine

## 2023-05-24 ENCOUNTER — Ambulatory Visit (RURAL_HEALTH_CENTER): Payer: Medicare PPO | Attending: Family Medicine

## 2023-05-24 DIAGNOSIS — E039 Hypothyroidism, unspecified: Secondary | ICD-10-CM | POA: Insufficient documentation

## 2023-05-24 DIAGNOSIS — K219 Gastro-esophageal reflux disease without esophagitis: Secondary | ICD-10-CM | POA: Insufficient documentation

## 2023-05-24 DIAGNOSIS — E785 Hyperlipidemia, unspecified: Secondary | ICD-10-CM | POA: Insufficient documentation

## 2023-05-24 DIAGNOSIS — N182 Chronic kidney disease, stage 2 (mild): Secondary | ICD-10-CM | POA: Insufficient documentation

## 2023-05-24 DIAGNOSIS — E119 Type 2 diabetes mellitus without complications: Secondary | ICD-10-CM | POA: Insufficient documentation

## 2023-05-24 DIAGNOSIS — E559 Vitamin D deficiency, unspecified: Secondary | ICD-10-CM | POA: Insufficient documentation

## 2023-05-24 LAB — COMPREHENSIVE METABOLIC PANEL, NON-FASTING
ALBUMIN/GLOBULIN RATIO: 1.5 — ABNORMAL HIGH (ref 0.8–1.4)
ALBUMIN: 4.4 g/dL (ref 3.5–5.7)
ALKALINE PHOSPHATASE: 100 U/L (ref 34–104)
ALT (SGPT): 14 U/L (ref 7–52)
ANION GAP: 7 mmol/L (ref 4–13)
AST (SGOT): 18 U/L (ref 13–39)
BILIRUBIN TOTAL: 0.8 mg/dL (ref 0.3–1.2)
BUN/CREA RATIO: 12 (ref 6–22)
BUN: 12 mg/dL (ref 7–25)
CALCIUM, CORRECTED: 9.8 mg/dL (ref 8.9–10.8)
CALCIUM: 10.1 mg/dL (ref 8.6–10.3)
CHLORIDE: 105 mmol/L (ref 98–107)
CO2 TOTAL: 28 mmol/L (ref 21–31)
CREATININE: 0.97 mg/dL (ref 0.60–1.30)
ESTIMATED GFR: 61 mL/min/{1.73_m2} (ref >59)
GLOBULIN: 3 (ref 2.9–5.4)
GLUCOSE: 162 mg/dL — ABNORMAL HIGH (ref 74–109)
OSMOLALITY, CALCULATED: 283 mOsm/kg (ref 270–290)
POTASSIUM: 3.9 mmol/L (ref 3.5–5.1)
PROTEIN TOTAL: 7.4 g/dL (ref 6.4–8.9)
SODIUM: 140 mmol/L (ref 136–145)

## 2023-05-24 LAB — CBC
HCT: 37.5 % (ref 31.2–41.9)
HGB: 12.9 g/dL (ref 10.9–14.3)
MCH: 32.6 pg (ref 24.7–32.8)
MCHC: 34.6 g/dL (ref 32.3–35.6)
MCV: 94.3 fL (ref 75.5–95.3)
MPV: 8 fL (ref 7.9–10.8)
PLATELETS: 192 10*3/uL (ref 140–440)
RBC: 3.97 10*6/uL (ref 3.63–4.92)
RDW: 13 % (ref 12.3–17.7)
WBC: 6.4 10*3/uL (ref 3.8–11.8)

## 2023-05-24 LAB — MICROALBUMIN/CREATININE RATIO, URINE, RANDOM
CREATININE RANDOM URINE: 44 mg/dL (ref 30–125)
MICROALBUMIN RANDOM URINE: <0.7 mg/dL

## 2023-05-24 LAB — LIPID PANEL
CHOL/HDL RATIO: 3.2
CHOLESTEROL: 137 mg/dL (ref <200)
HDL CHOL: 43 mg/dL (ref >=40)
LDL CALC: 67 mg/dL (ref 0–100)
TRIGLYCERIDES: 137 mg/dL (ref <=150)
VLDL CALC: 27 mg/dL (ref 0–50)

## 2023-05-24 LAB — MAGNESIUM: MAGNESIUM: 2.2 mg/dL (ref 1.9–2.7)

## 2023-05-24 LAB — HGA1C (HEMOGLOBIN A1C WITH EST AVG GLUCOSE): HEMOGLOBIN A1C: 6.8 % — ABNORMAL HIGH (ref 4.0–6.0)

## 2023-05-24 LAB — THYROID STIMULATING HORMONE (SENSITIVE TSH): TSH: 3.167 u[IU]/mL (ref 0.450–5.330)

## 2023-05-24 LAB — VITAMIN D 25 TOTAL: VITAMIN D: 42 ng/mL (ref 30–100)

## 2023-05-24 IMAGING — MR MRI HIP LT W/O CONTRAST
5 of 7 series · 24 of 40 positions shown · IV contrast (gadolinium)
Comparison: Left hip and pelvis x-ray dated 02/11/2020.

﻿EXAM:  [id number]   MRI HIP LT W/O CONTRAST
INDICATION: 76-year-old sustained trauma due to fall. Persistent left hip pain.  No prior hip surgery.
TECHNIQUE: Multiplanar, multisequential MRI of the pelvis including left hip was performed without gadolinium contrast.

[Series 7: T2 fat-sat · axial · left · 4.5mm · 0.94mm/px · z∈[-98,+47]mm · 6 of 30 slices shown (1 of 3)]
[im 1/30]
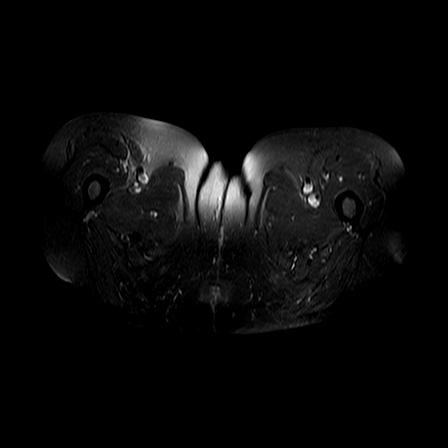
[im 6/30]
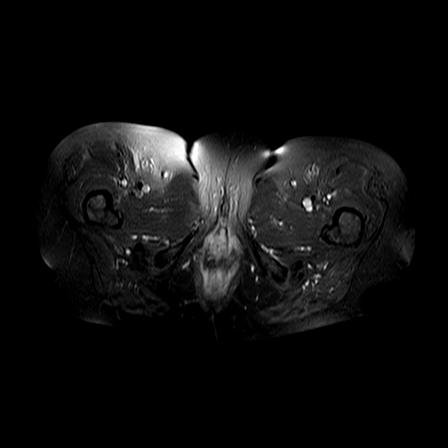
[im 12/30]
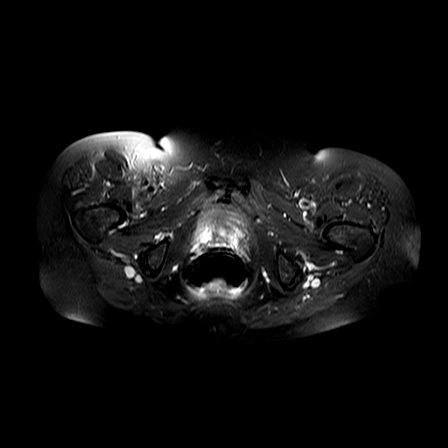
[im 18/30]
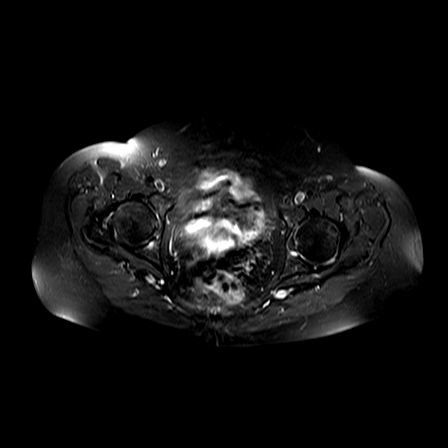
[im 24/30]
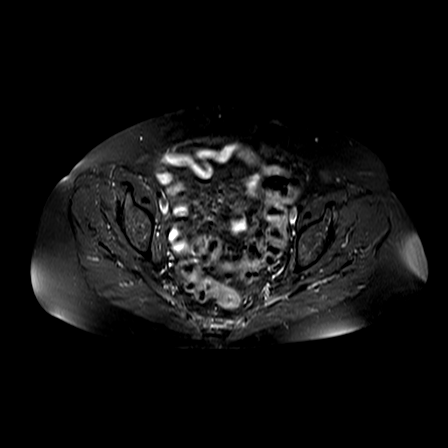
[im 30/30]
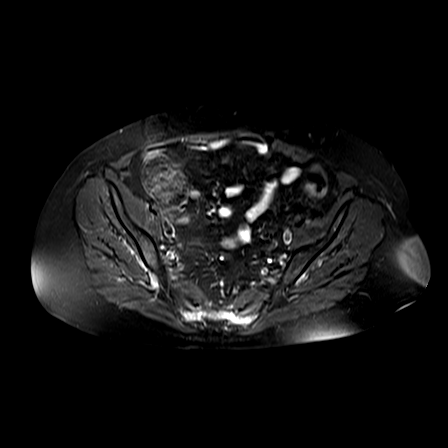

[Series 8: T1 · axial · left · 4.5mm · 0.82mm/px · z∈[-98,+47]mm · 7 of 30 slices shown (1 of 2)]
[im 1/30]
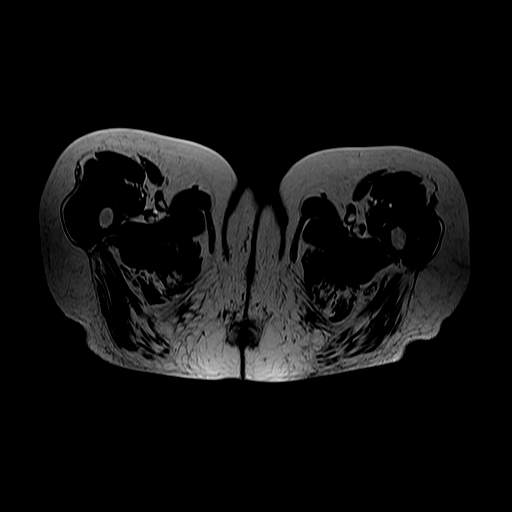
[im 5/30]
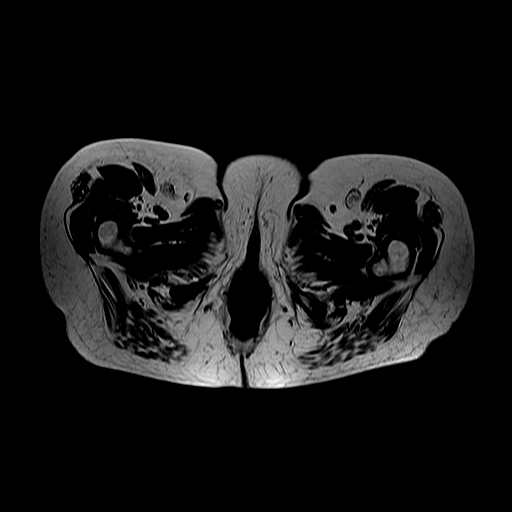
[im 10/30]
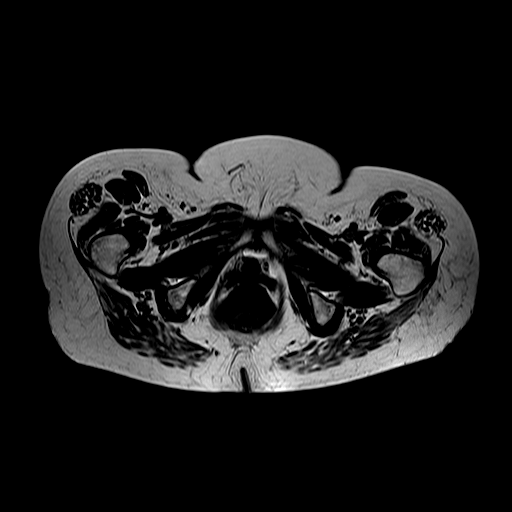
[im 15/30]
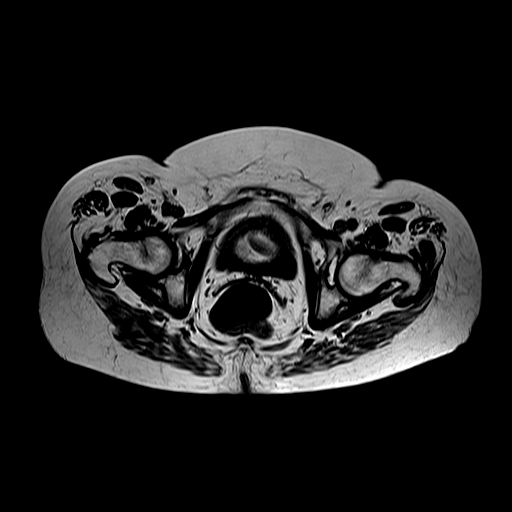
[im 20/30]
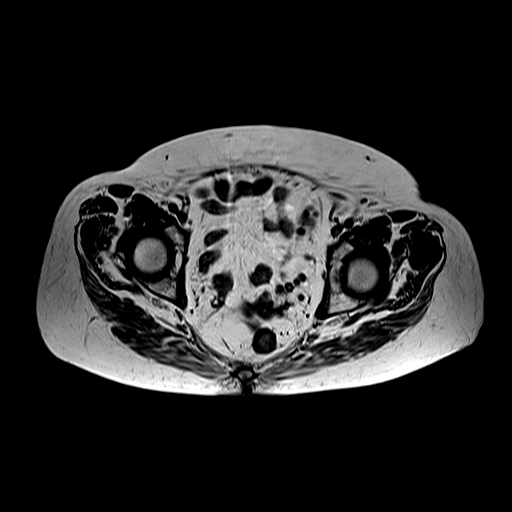
[im 25/30]
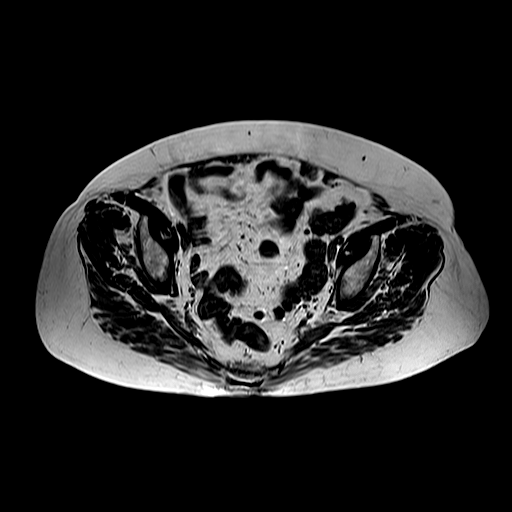
[im 30/30]
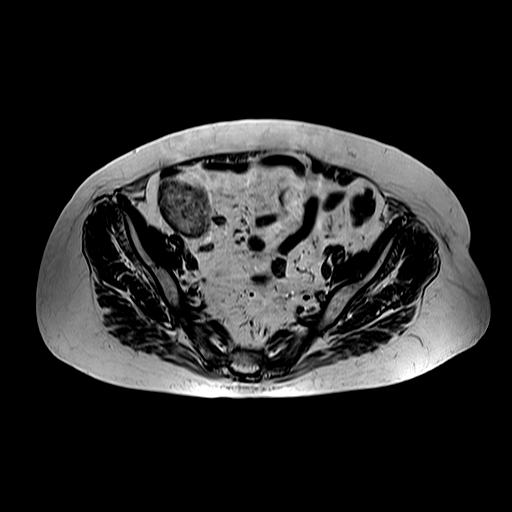

[Series 10: T1 · coronal · left · 4.5mm · 0.82mm/px · 1 of 20 slices shown (2 of 2)]
[im 1/20]
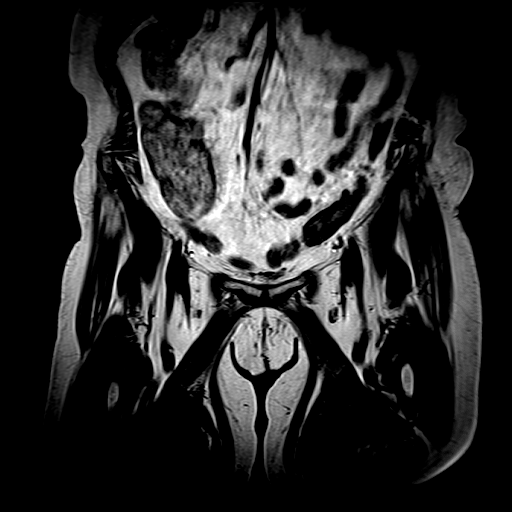

[Series 11: T2 fat-sat · coronal · left · 4.5mm · 0.82mm/px · 5 of 20 slices shown (2 of 3)]
[im 1/20]
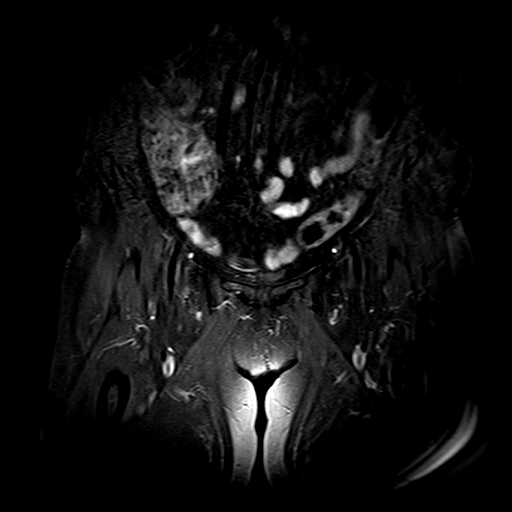
[im 5/20]
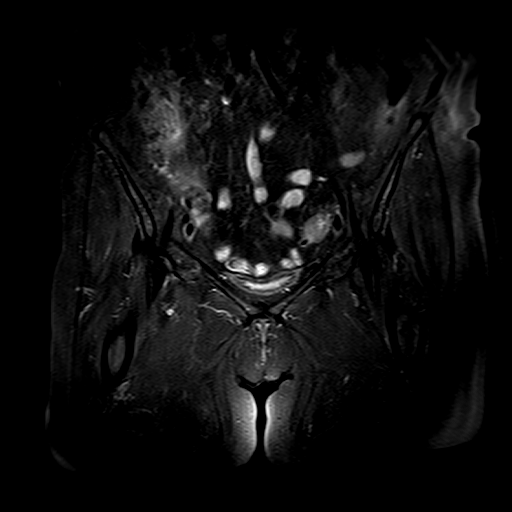
[im 10/20]
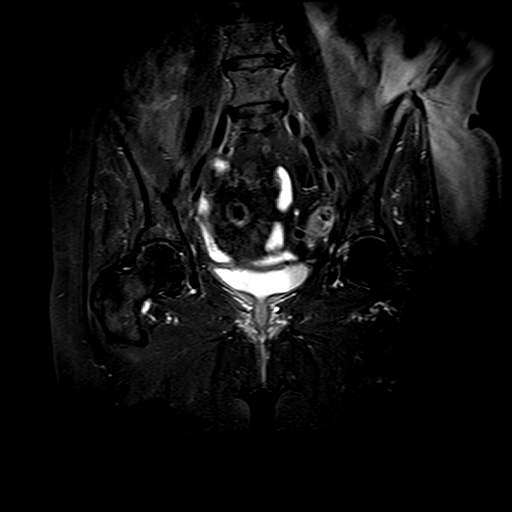
[im 15/20]
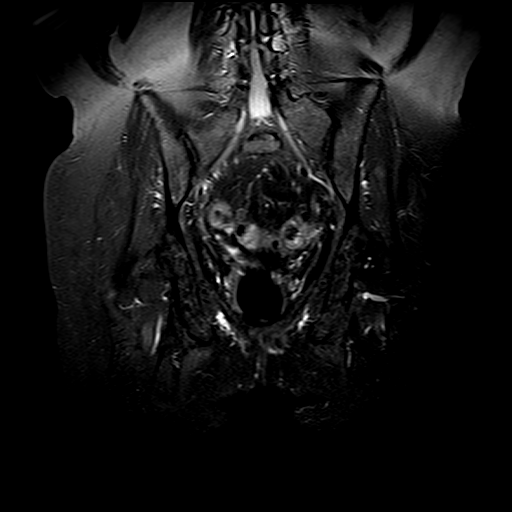
[im 20/20]
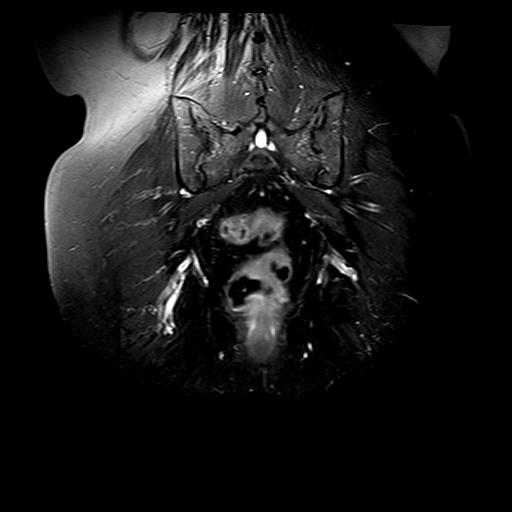

[Series 13: T2 fat-sat · sagittal · left · 4.5mm · 0.78mm/px · 5 of 20 slices shown (3 of 3)]
[im 1/20]
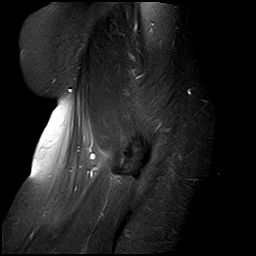
[im 5/20]
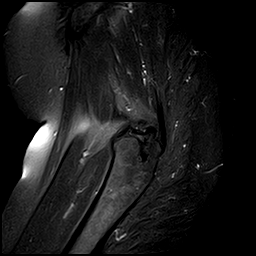
[im 10/20]
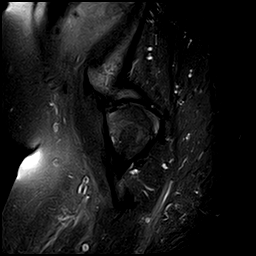
[im 15/20]
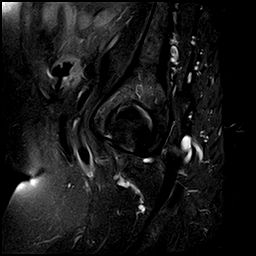
[im 20/20]
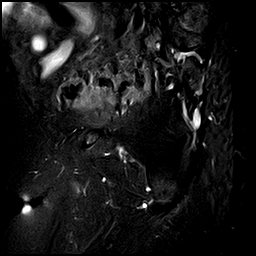

[24 of 40 positions shown; findings below may reference images not displayed]

FINDINGS: No acute fracture or bone bruise is seen at the left hip.  No evidence of labral tear.  No significant hematoma or bruising of soft tissues is seen around the left hip. Mild osteoarthritis of both hip joints.

No acute bony lesions elsewhere in the pelvis.  No evidence of avascular necrosis of femoral head.
IMPRESSION: 1. No acute bony lesions of the left hip.  Mild osteoarthritis of both hip joints.  No evidence of avascular necrosis. 

2. No acute findings of acetabular labrum. No acute soft tissue abnormalities at the left hip.

## 2023-05-29 ENCOUNTER — Encounter (RURAL_HEALTH_CENTER): Payer: Self-pay | Admitting: Family Medicine

## 2023-06-02 ENCOUNTER — Other Ambulatory Visit: Payer: Self-pay

## 2023-06-13 ENCOUNTER — Ambulatory Visit (RURAL_HEALTH_CENTER): Payer: Medicare PPO | Attending: Family Medicine | Admitting: Family Medicine

## 2023-06-13 ENCOUNTER — Other Ambulatory Visit: Payer: Self-pay

## 2023-06-13 ENCOUNTER — Ambulatory Visit: Payer: Medicare PPO | Attending: Family Medicine | Admitting: Family Medicine

## 2023-06-13 ENCOUNTER — Encounter (RURAL_HEALTH_CENTER): Payer: Self-pay | Admitting: Family Medicine

## 2023-06-13 VITALS — BP 138/80 | HR 101 | Temp 99.3°F | Ht 66.0 in | Wt 153.0 lb

## 2023-06-13 DIAGNOSIS — M25552 Pain in left hip: Secondary | ICD-10-CM | POA: Insufficient documentation

## 2023-06-13 DIAGNOSIS — M2550 Pain in unspecified joint: Secondary | ICD-10-CM | POA: Insufficient documentation

## 2023-06-13 DIAGNOSIS — Z79811 Long term (current) use of aromatase inhibitors: Secondary | ICD-10-CM | POA: Insufficient documentation

## 2023-06-13 DIAGNOSIS — Z780 Asymptomatic menopausal state: Secondary | ICD-10-CM | POA: Insufficient documentation

## 2023-06-13 DIAGNOSIS — M25562 Pain in left knee: Secondary | ICD-10-CM | POA: Insufficient documentation

## 2023-06-13 DIAGNOSIS — J029 Acute pharyngitis, unspecified: Secondary | ICD-10-CM | POA: Insufficient documentation

## 2023-06-13 DIAGNOSIS — N182 Chronic kidney disease, stage 2 (mild): Secondary | ICD-10-CM | POA: Insufficient documentation

## 2023-06-13 DIAGNOSIS — K219 Gastro-esophageal reflux disease without esophagitis: Secondary | ICD-10-CM | POA: Insufficient documentation

## 2023-06-13 DIAGNOSIS — M25561 Pain in right knee: Secondary | ICD-10-CM | POA: Insufficient documentation

## 2023-06-13 DIAGNOSIS — I10 Essential (primary) hypertension: Secondary | ICD-10-CM | POA: Insufficient documentation

## 2023-06-13 DIAGNOSIS — E785 Hyperlipidemia, unspecified: Secondary | ICD-10-CM | POA: Insufficient documentation

## 2023-06-13 DIAGNOSIS — Z5181 Encounter for therapeutic drug level monitoring: Secondary | ICD-10-CM | POA: Insufficient documentation

## 2023-06-13 DIAGNOSIS — M25551 Pain in right hip: Secondary | ICD-10-CM | POA: Insufficient documentation

## 2023-06-13 LAB — POCT RAPID STREP A: RAPID STREP A (POCT): POSITIVE

## 2023-06-13 LAB — CREATINE KINASE (CK), TOTAL, SERUM OR PLASMA: CREATINE KINASE: 42 U/L (ref 30–223)

## 2023-06-13 LAB — SEDIMENTATION RATE: ERYTHROCYTE SEDIMENTATION RATE (ESR): 14 mm/hr (ref <30)

## 2023-06-13 LAB — C-REACTIVE PROTEIN (CRP): C-REACTIVE PROTEIN (CRP): <0.1 mg/dL — ABNORMAL LOW (ref 0.1–0.5)

## 2023-06-13 MED ORDER — PREDNISONE 20 MG TABLET
20.0000 mg | ORAL_TABLET | Freq: Every day | ORAL | 0 refills | Status: AC
Start: 2023-06-13 — End: 2023-06-18

## 2023-06-13 MED ORDER — AMOXICILLIN 875 MG-POTASSIUM CLAVULANATE 125 MG TABLET
1.0000 | ORAL_TABLET | Freq: Two times a day (BID) | ORAL | 0 refills | Status: AC
Start: 2023-06-13 — End: 2023-06-23

## 2023-06-13 MED ORDER — SENNOSIDES 8.6 MG-DOCUSATE SODIUM 50 MG TABLET
2.0000 | ORAL_TABLET | Freq: Every evening | ORAL | 3 refills | Status: DC
Start: 2023-06-13 — End: 2023-06-13

## 2023-06-13 MED ORDER — FAMOTIDINE 40 MG TABLET
40.0000 mg | ORAL_TABLET | Freq: Every evening | ORAL | 3 refills | Status: DC
Start: 2023-06-13 — End: 2023-06-13

## 2023-06-13 MED ORDER — ANASTROZOLE 1 MG TABLET
1.0000 mg | ORAL_TABLET | Freq: Every day | ORAL | 1 refills | Status: DC
Start: 2023-06-13 — End: 2023-07-25

## 2023-06-13 MED ORDER — OMEPRAZOLE 20 MG CAPSULE,DELAYED RELEASE
20.0000 mg | DELAYED_RELEASE_CAPSULE | Freq: Every day | ORAL | 3 refills | Status: DC
Start: 2023-06-13 — End: 2023-06-13

## 2023-06-13 MED ORDER — ZOLPIDEM 10 MG TABLET
10.0000 mg | ORAL_TABLET | Freq: Every evening | ORAL | 1 refills | Status: DC | PRN
Start: 2023-06-13 — End: 2023-10-31

## 2023-06-13 MED ORDER — FAMOTIDINE 40 MG TABLET
40.0000 mg | ORAL_TABLET | Freq: Every evening | ORAL | 3 refills | Status: DC
Start: 2023-06-13 — End: 2023-10-31

## 2023-06-13 MED ORDER — SENNOSIDES 8.6 MG-DOCUSATE SODIUM 50 MG TABLET
2.0000 | ORAL_TABLET | Freq: Every evening | ORAL | 3 refills | Status: DC
Start: 2023-06-13 — End: 2023-10-31

## 2023-06-13 MED ORDER — ZOLPIDEM 10 MG TABLET
10.0000 mg | ORAL_TABLET | Freq: Every evening | ORAL | 1 refills | Status: DC | PRN
Start: 2023-06-13 — End: 2023-06-13

## 2023-06-13 MED ORDER — BELBUCA 150 MCG BUCCAL FILM
1.0000 | ORAL_FILM | Freq: Two times a day (BID) | BUCCAL | 0 refills | Status: AC
Start: 2023-06-13 — End: 2023-07-13

## 2023-06-13 MED ORDER — ATORVASTATIN 40 MG TABLET
40.0000 mg | ORAL_TABLET | Freq: Every evening | ORAL | 3 refills | Status: DC
Start: 2023-06-13 — End: 2023-10-31

## 2023-06-13 MED ORDER — OMEPRAZOLE 20 MG CAPSULE,DELAYED RELEASE
20.0000 mg | DELAYED_RELEASE_CAPSULE | Freq: Every day | ORAL | 3 refills | Status: DC
Start: 2023-06-13 — End: 2023-10-31

## 2023-06-13 NOTE — Progress Notes (Signed)
FAMILY MEDICINE, [facility]  [address]  [place] [address]  Operated by [facility]  Progress Note    Name: Kari Gordon MRN:  [id number]   Date: 06/13/2023 DOB:  03-11-1946 (77 y.o.)             Chief Complaint: Follow Up (Follow up)  Subjective   Subjective:   Patient presents for 22-month follow-up and complaint of left hip pain. 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 Patient points to sacrum when asked to localize the pain. Pain is constant, 6/10; also has bilateral knee pain. Patient admits compliance with vitamin D. Denies numbness or tingling down legs but admits to "weakness in my upper legs." She just started physical therapy doing stretches. She has worsening of her pain after therapy. She was taking Mobic but stopped due to having CKD stage 2.  She notes that it really did not help a lot. She does not want to get addicted to anything but would like pain relief. She cannot tolerate hydrocodone.  She has increased pain in her groin and hips bilaterally; she has been using robaxin from the neurologist to help with this. Coenzyme q 10 has also helped a little with her pain.    Objective   Objective :  BP 138/80 (Site: Left, Patient Position: Sitting, Cuff Size: Adult)   Pulse (!) 101   Temp 37.4 C (99.3 F) (Tympanic)   Ht 1.676 m (5\' 6" )   Wt 69.4 kg (153 lb)   SpO2 96%   BMI 24.69 kg/m     RRR, no murmurs or rubs, lungs CTA bilaterally, no wheezes or crackles; tympanic membranes visualized, no masses or hemorrhage; buccal mucosa moist, no lesions, erythema, or exudates; neck supple, no lymphadenopathy or carotid bruits; 5/5 muscle strength in bilateral LE, 2/4 pulses in LE, tenderness reproduced just lateral to L PSIS  Data reviewed:    Current Outpatient Medications   Medication Sig    Amlodipine-Valsartan 5-160 mg Oral Tablet Take 1 Tablet by mouth Once a day    amoxicillin-pot  clavulanate (AUGMENTIN) 875-125 mg Oral Tablet Take 1 Tablet by mouth Twice daily for 10 days    anastrozole (ARIMIDEX) 1 mg Oral Tablet Take 1 Tablet (1 mg total) by mouth Once a day    aspirin (ECOTRIN) 81 mg Oral Tablet, Delayed Release (E.C.) Take 1 Tablet (81 mg total) by mouth    atorvastatin (LIPITOR) 40 mg Oral Tablet Take 1 Tablet (40 mg total) by mouth Every evening    buprenorphine HCL (BELBUCA) 150 mcg Buccal Film Place 1 Film into mouth between cheek and gum Twice daily for 30 days    clobetasoL (TEMOVATE) 0.05 % Cream Apply topically    Clobetasol 0.05 % Gel APPLY 1 APPLICATION TOPICALLY DAILY    cyclobenzaprine (FLEXERIL) 5 mg Oral Tablet Take 1 Tablet (5 mg total) by mouth Three times a day    famotidine (PEPCID) 40 mg Oral Tablet Take 1 Tablet (40 mg total) by mouth Every evening    fremanezumab-vfrm (AJOVY AUTOINJECTOR) 225 mg/1.5 mL Subcutaneous Auto-Injector Inject 1.5 mL (225 mg total) under the skin Every 30 days for 180 days    ketoconazole (NIZORAL) 2 % Cream Apply topically    latanoprost (XALATAN) 0.005 % Ophthalmic Drops     omeprazole (PRILOSEC) 20 mg Oral Capsule, Delayed Release(E.C.) Take 1 Capsule (20 mg total) by mouth Once a  day    predniSONE (DELTASONE) 20 mg Oral Tablet Take 1 Tablet (20 mg total) by mouth Once a day for 5 days    sennosides-docusate sodium (SENOKOT-S) 8.6-50 mg Oral Tablet Take 2 Tablets by mouth Every night    sucralfate (CARAFATE) 1 gram Oral Tablet Take 1 Tablet (1 g total) by mouth Four times a day - before meals and bedtime    SYNTHROID 25 mcg Oral Tablet Take 1 Tablet (25 mcg total) by mouth Every morning    ubrogepant (UBRELVY) 100 mg Oral Tablet Take by mouth One tab every 2 hr prn migraine    zolpidem (AMBIEN) 10 mg Oral Tablet Take 1 Tablet (10 mg total) by mouth Every night as needed for Insomnia        Assessment/Plan  Problem List Items Addressed This Visit          Cardiovascular System    Hyperlipidemia LDL goal <70       Nephrology    CKD (chronic  kidney disease), symptom management only, stage 2 (mild)       Digestive    Chronic GERD       Other    Long term (current) use of aromatase inhibitors     Other Visit Diagnoses       Polyarthralgia    -  Primary    Relevant Orders    SEDIMENTATION RATE    C-REACTIVE PROTEIN (CRP)    CREATINE KINASE (CK), TOTAL, SERUM OR PLASMA    Gastroesophageal reflux disease, unspecified whether esophagitis present        Primary hypertension        Post-menopausal        Therapeutic drug monitoring        Relevant Orders    DRUG ABUSE PANEL 7 W/CONF PLUS ETHANOL, SERUM    Sore throat        Relevant Orders    POCT Rapid Strep (Completed)              Kari Gordon, MED STUDENT     I was personally present when the student was taking history, performing the exam, and during any medical decision-making activity.  I personally verified the history, performed the exam, and medical decision-making as edited in the student's note. I agree with the medical student's documentation.    For strep, add amoxil.  For stiffness, will use prednisone for 5 days. She will watch her glucose as I expect it to go up a little as she has underlying diet controlled diabetes.    Pain contract signed with me today. PDMP reviewed. Drug screen in keeping with state law. Will start low dose belbucca and titrate slowly. Start at night and use and make sure tolerates it before increasing to bid.    Due for bone density; will try to schedule  Last mammogram with Dr Kari Gordon. Not due until next year as was ok per pt    Migraines better with ajovy; follows with Dr. Sedalia Gordon who takes care of this for her with good control.    Sleeping with Ambien; has been on this for some time. When pain is in better control, may see if we can 1/2 this dose and still get sleep or stop altogether.      Will check ck/esr/crp today. Push fluids. Try anti-inflammatory diet with cinnamon/tumeric which can have anti-inflammatory properties.  Avoid NSAID due to CKD.    Kari Pedro, DO

## 2023-06-14 ENCOUNTER — Other Ambulatory Visit (RURAL_HEALTH_CENTER): Payer: Self-pay | Admitting: Family Medicine

## 2023-06-14 ENCOUNTER — Encounter (INDEPENDENT_AMBULATORY_CARE_PROVIDER_SITE_OTHER): Payer: Self-pay | Admitting: NEUROLOGY

## 2023-06-14 ENCOUNTER — Ambulatory Visit (INDEPENDENT_AMBULATORY_CARE_PROVIDER_SITE_OTHER): Payer: Medicare PPO | Admitting: NEUROLOGY

## 2023-06-14 VITALS — BP 135/59 | HR 99 | Temp 97.7°F | Wt 152.2 lb

## 2023-06-14 DIAGNOSIS — R42 Dizziness and giddiness: Secondary | ICD-10-CM

## 2023-06-14 DIAGNOSIS — G43701 Chronic migraine without aura, not intractable, with status migrainosus: Secondary | ICD-10-CM

## 2023-06-14 NOTE — Progress Notes (Signed)
ASSESSMENT  MIGRAINE WITHOUT AURA:  She is a 77 year old woman who follows-up for longstanding history of headaches.  Headaches meet criteria for migraine.  They are unilateral, associated with photo and phonophobia, nausea and worsened with activity.  She currently has seen a significant reduction in frequency since being on Ajovy with Ubrelvy as abortive.  MRI of the brain is largely unremarkable.  Neurologic exam today is nonfocal.  She has this point failed multiple classes of medications including beta-blockers, ACE inhibitors, SNRI, antiepileptics and CGRP antagonists.  Ajovy has improved her frequency and severity and Kari Gordon is helpful if she can take it and rest.  We again discussed medication overuse headache, though she has cut back on the use of Tylenol.  We will try to limit the use of this or any other abortive medications to no more than 2-3 times per week.    DIZZINESS:  I believe this is likely related to 1 as workup and treatment has otherwise not lead to an alternative diagnosis.  We will continue to monitor.    PLAN  1. Continue Ajovy injections monthly      Continue Ubrelvy p.r.n.      Continue Flexeril 5mg  PRN      Counseled on medication overuse headache      Encouraged aggressive hydration  2. Continue to monitor    RTC 6 months    Thank you for allowing me to participate in your patient's care and please do not hesitate to contact me for any questions or concerns.    Patrick North, DO  Assistant Professor of Neurology  [facility]     [phone]: I will continue to be the provider focal point in managing the chronic complex neurological condition  ==========================================================================================================================================    NAME:  Kari Gordon  DOB:  1946/12/01  VISIT DATE:  09/29/2022    CC:  Migraine    Patient seen in consultation at the request of Dr. Weyman Gordon  History obtained  from the patient and chart/records  Age of patient:  77 y.o.    INTERVAL: Since last visit, she has been doing quite well. She has only had 2-3 major headaches. Still has more frequent mild headaches though these are not bothersome. She is tolerating Ajovy well. Requests flexeril for breakthrough as it has been helpful previously.    HPI:   I had the pleasure of seeing your patient in neurology clinic for an outpatient consultation, who is a 77 y.o. year old female who was referred for evaluation of trigeminal neuralgia.  Please allow me to summarize the history for the record.    She states she has a longstanding history of migraine dating back to childhood.  She states that they are characterized by right neck pain that radiate into the temporal.  At times she can have headaches that begin between the eyes.  They are associated with photo and phonophobia as well as nausea.  They are worsened with activity.  She does have sensitivity to smells as well.  These headaches can last days.  She typically has more mild headaches on a daily basis and then we will have 2-3 incapacitating headaches per month that last multiple days.  She is taking Tylenol on a daily basis.  Currently she is on Ajovy and has been for the past 3 months and finds it somewhat helpful in that she will get some headache-free days for a few days following a dose.  She is using  Kari Gordon as an abortive treatment.  This has been quite helpful and she can often curtail the stronger headaches with this medication.  She is currently reserving the use of this for most severe headaches.  She has seen multiple    Previous medications:  Cymbalta  Irbesartan  Aimovig - initially effective, then lost efficacy  Topamax - ineffective  Nurtec - ineffective  Metoprolol - ineffective    Current medications:   Ashby Dawes    Of note she does have a history of right trigeminal neuralgia.  This started in 2019 or 2020.  Angiography revealed contact with the nerve  by nearby vessel.  She was referred to [facility] and underwent neurosurgical procedure to ameliorate her symptoms.  This was successful and she is not had symptoms since the procedure.  Had trigeminal neuralgia w neurologist to this point.  hich started in 2019 or 2020. Used medications which were not helpful. Then was referred to [facility] by Dr. Marta Antu and had surgery which was successful.     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    Abnormal cardiovascular stress test    UTI (urinary tract infection)    Alopecia    Insomnia    Herpes zoster    Dysuria    Type 2 diabetes, diet controlled (CMS HCC)    Hyperlipidemia LDL goal <70    CKD (chronic kidney disease), symptom management only, stage 2 (mild)    Vitamin D deficiency     Past Surgical History:   Procedure Laterality Date    COLONOSCOPY      dr Kari Gordon 04/25/22 diverticular disease    EYE SURGERY      cornea and cataract surgery 12/29/21 left 05/04/22 right    HX BREAST LUMPECTOMY      HX CHOLECYSTECTOMY      HX HEART CATHETERIZATION      HX HYSTERECTOMY      LYMPH NODE BIOPSY      NERVE SURGERY      NOSE SURGERY      UTERINE FIBROID SURGERY           Family Medical History:       Problem Relation (Age of Onset)    Glaucoma Brother    Heart Disease Mother, Father    Hypertension (High Blood Pressure) Mother, Father    Stroke Mother, Father            Current Outpatient Medications   Medication Sig Dispense Refill     Amlodipine-Valsartan 5-160 mg Oral Tablet Take 1 Tablet by mouth Once a day      amoxicillin-pot clavulanate (AUGMENTIN) 875-125 mg Oral Tablet Take 1 Tablet by mouth Twice daily for 10 days 20 Tablet 0    anastrozole (ARIMIDEX) 1 mg Oral Tablet Take 1 Tablet (1 mg total) by mouth Once a day 90 Tablet 1    aspirin (ECOTRIN) 81 mg Oral Tablet, Delayed Release (E.C.) Take 1 Tablet (81 mg total) by mouth  atorvastatin (LIPITOR) 40 mg Oral Tablet Take 1 Tablet (40 mg total) by mouth Every evening 90 Tablet 3    buprenorphine HCL (BELBUCA) 150 mcg Buccal Film Place 1 Film into mouth between cheek and gum Twice daily for 30 days 60 Each 0    clobetasoL (TEMOVATE) 0.05 % Cream Apply topically      Clobetasol 0.05 % Gel APPLY 1 APPLICATION TOPICALLY DAILY 30 g 11    cyclobenzaprine (FLEXERIL) 5 mg Oral Tablet Take 1 Tablet (5 mg total) by mouth Three times a day      famotidine (PEPCID) 40 mg Oral Tablet Take 1 Tablet (40 mg total) by mouth Every evening 90 Tablet 3    fremanezumab-vfrm (AJOVY AUTOINJECTOR) 225 mg/1.5 mL Subcutaneous Auto-Injector Inject 1.5 mL (225 mg total) under the skin Every 30 days for 180 days 4.5 mL 1    ketoconazole (NIZORAL) 2 % Cream Apply topically      latanoprost (XALATAN) 0.005 % Ophthalmic Drops       omeprazole (PRILOSEC) 20 mg Oral Capsule, Delayed Release(E.C.) Take 1 Capsule (20 mg total) by mouth Once a day 90 Capsule 3    predniSONE (DELTASONE) 20 mg Oral Tablet Take 1 Tablet (20 mg total) by mouth Once a day for 5 days 5 Tablet 0    sennosides-docusate sodium (SENOKOT-S) 8.6-50 mg Oral Tablet Take 2 Tablets by mouth Every night 180 Tablet 3    sucralfate (CARAFATE) 1 gram Oral Tablet Take 1 Tablet (1 g total) by mouth Four times a day - before meals and bedtime 60 Tablet 2    SYNTHROID 25 mcg Oral Tablet Take 1 Tablet (25 mcg total) by mouth Every morning 90 Tablet 3    ubrogepant (UBRELVY) 100 mg Oral Tablet Take by mouth One tab every 2 hr prn migraine      zolpidem (AMBIEN)  10 mg Oral Tablet Take 1 Tablet (10 mg total) by mouth Every night as needed for Insomnia 90 Tablet 1     No current facility-administered medications for this visit.     Allergies   Allergen Reactions    Hydrocodone-Acetaminophen Itching and Nausea/ Vomiting     headache  headache      Sulfa (Sulfonamides) Rash    Duloxetine Mental Status Effect    Erythromycin Nausea/ Vomiting     Social History     Socioeconomic History    Marital status: Married     Spouse name: Not on file    Number of children: 2    Years of education: Not on file    Highest education level: Not on file   Occupational History    Not on file   Tobacco Use    Smoking status: Never    Smokeless tobacco: Never   Vaping Use    Vaping status: Never Used   Substance and Sexual Activity    Alcohol use: Never    Drug use: Never    Sexual activity: Not Currently     Partners: Male   Other Topics Concern    Not on file   Social History Narrative    Not on file     Social Determinants of Health     Financial Resource Strain: Not on file   Transportation Needs: Not on file   Social Connections: Not on file   Intimate Partner Violence: Not on file   Housing Stability: Not on file       ============================================================================================================================================  GENERAL EXAMINATION  BP (!) 135/59 (Site:  Right, Patient Position: Sitting, Cuff Size: Adult)   Pulse 99   Temp 36.5 C (97.7 F) (Temporal)   Wt 69 kg (152 lb 3.2 oz)   SpO2 91%   BMI 24.57 kg/m     Vital signs personally reviewed  General: No acute distress, alert  HEENT: Normocephalic, no scleral icterus  Extremities: No significant edema, No cyanosis    NEUROLOGIC EXAM  On neurological exam, patient was awake, alert and answering questions appropriately  Speech was fluent, without dysarthria or aphasia.    CN  II-XII: grossly intact    MOTOR  Bulk: normal  Abnormal Movements: none    Strength:     MRC Grading Scale   Right  Left   Deltoid 5 5   Biceps 5 5   Triceps 5 5   Wrist Extension - -   Wrist Flexion - -   Finger Extension - -   Finger Abduction - -   Finger Flexion - -   Hip Flexion 5 5   Hip Extension - -   Hip Abduction - -   Hip Adduction - -   Knee Extension 5 5   Knee Flexion 5 5   Ankle Dorsiflexion - -   Ankle Plantarflexion - -   Toe Extension - -   Toe Flexion - -     REFLEXES   Right Left   Biceps 3 3   Triceps 2 2   Brachioradialis 2 2   Patellar 2 (brisk) 2 (brisk)   Achilles 2 2   Plantar - -   Hoffman - -   Pectoralis - -   Jaw Jerk - -       SENSORY  Light touch:  Intact throughout    GAIT  General: casual, normal gait    ================================================================================================================================LABS  Personal Review of prior labs is notable for:   2024  TSH normal  Anti CCP negative   ANA negative  CMP largely within normal limits  CBC largely within normal  2023  TSH within normal limits   A1c 6.1   CMP largely within normal limits   CBC largely within normal limits  IMAGING  Personal Review of imaging is notable for:   MRI of the brain without contrast July 03, 2022  Scattered FLAIR hyperintensities consistent with chronic white matter disease, relatively well-preserved volume for age    OTHER DIAGNOSTICS  Personal Review of other prior diagnostics is notable for:  Not applicable

## 2023-06-16 ENCOUNTER — Encounter (RURAL_HEALTH_CENTER): Payer: Self-pay | Admitting: Family Medicine

## 2023-06-19 LAB — DRUG ABUSE PANEL 7 W/CONF PLUS ETHANOL, SERUM
ALCOHOL,ETHYL: NOT DETECTED
ALCOHOL,ETHYL: NOT DETECTED
AMPHETAMINES: NEGATIVE
BARBITURATES: NEGATIVE
BENZODIAZEPINES: NEGATIVE
COCAINE METABOLITES: NEGATIVE
METHADONE: NEGATIVE
OPIATES: NEGATIVE
PCP (PHENCYCLIDINE): NEGATIVE

## 2023-06-24 ENCOUNTER — Other Ambulatory Visit (RURAL_HEALTH_CENTER): Payer: Self-pay | Admitting: Family Medicine

## 2023-06-24 MED ORDER — AMLODIPINE 5 MG-VALSARTAN 160 MG TABLET
1.0000 | ORAL_TABLET | Freq: Every day | ORAL | 0 refills | Status: DC
Start: 2023-06-24 — End: 2023-10-31

## 2023-06-29 ENCOUNTER — Encounter (RURAL_HEALTH_CENTER): Payer: Self-pay | Admitting: Family Medicine

## 2023-07-02 ENCOUNTER — Other Ambulatory Visit: Payer: Self-pay

## 2023-07-02 ENCOUNTER — Inpatient Hospital Stay
Admission: RE | Admit: 2023-07-02 | Discharge: 2023-07-02 | Disposition: A | Discharge status: Home / Self Care | Payer: Medicare PPO | Source: Ambulatory Visit | Attending: Family Medicine | Admitting: Family Medicine

## 2023-07-02 DIAGNOSIS — Z780 Asymptomatic menopausal state: Secondary | ICD-10-CM | POA: Insufficient documentation

## 2023-07-02 NOTE — Result Encounter Note (Signed)
Excellent bone density

## 2023-07-18 ENCOUNTER — Ambulatory Visit (RURAL_HEALTH_CENTER): Payer: Self-pay | Admitting: Family Medicine

## 2023-07-24 ENCOUNTER — Other Ambulatory Visit (INDEPENDENT_AMBULATORY_CARE_PROVIDER_SITE_OTHER): Payer: Self-pay | Admitting: NEUROLOGY

## 2023-07-24 ENCOUNTER — Other Ambulatory Visit (HOSPITAL_COMMUNITY): Payer: Self-pay | Admitting: NURSE PRACTITIONER

## 2023-07-31 ENCOUNTER — Encounter (RURAL_HEALTH_CENTER): Payer: Self-pay | Admitting: Family Medicine

## 2023-07-31 ENCOUNTER — Other Ambulatory Visit: Payer: Self-pay

## 2023-07-31 ENCOUNTER — Ambulatory Visit (RURAL_HEALTH_CENTER): Payer: Medicare PPO | Attending: Family Medicine | Admitting: Family Medicine

## 2023-07-31 VITALS — BP 132/76 | HR 106 | Temp 99.4°F | Resp 18 | Ht 66.0 in | Wt 154.5 lb

## 2023-07-31 DIAGNOSIS — N905 Atrophy of vulva: Secondary | ICD-10-CM | POA: Insufficient documentation

## 2023-07-31 DIAGNOSIS — L900 Lichen sclerosus et atrophicus: Secondary | ICD-10-CM | POA: Insufficient documentation

## 2023-07-31 DIAGNOSIS — M2550 Pain in unspecified joint: Secondary | ICD-10-CM | POA: Insufficient documentation

## 2023-07-31 DIAGNOSIS — Z853 Personal history of malignant neoplasm of breast: Secondary | ICD-10-CM | POA: Insufficient documentation

## 2023-07-31 DIAGNOSIS — B3731 Acute candidiasis of vulva and vagina: Secondary | ICD-10-CM | POA: Insufficient documentation

## 2023-07-31 MED ORDER — FLUCONAZOLE 150 MG TABLET
150.0000 mg | ORAL_TABLET | Freq: Every day | ORAL | 1 refills | Status: DC
Start: 2023-07-31 — End: 2023-10-10

## 2023-07-31 MED ORDER — RESINOL 2 %-55 % TOPICAL OINTMENT
TOPICAL_OINTMENT | Freq: Three times a day (TID) | CUTANEOUS | 3 refills | Status: AC
Start: 2023-07-31 — End: ?

## 2023-07-31 NOTE — Progress Notes (Signed)
FAMILY MEDICINE, [facility] FAMILY MEDICINE [facility]  [address]  [place] [address]  Operated by [facility]     Name: Kari Gordon MRN:  [id number]   Date of Birth: 1946/10/19 Age: 77 y.o.   Date: 07/31/2023  Time: 10:35     Provider: Weyman Pedro, DO    Assessment/Plan:  Problem List Items Addressed This Visit          Dermatology    Lichen sclerosus et atrophicus - Primary     Other Visit Diagnoses       HX: breast cancer        Polyarthralgia        Vulvar atrophy        Vaginal yeast infection             Add dilflucan  Use clobetasol every day for three months. Will re-evaluate at that time  Has atrophy but hx of er positive breast cancer. Will stick with steroid for now.    She is not taking belbucca but she has it.  Try lift 0.6cm from [employer]. Continue PT and continue f/u with chiropractor      Discontinued Medications    CLOBETASOL (TEMOVATE) 0.05 % CREAM    Apply topically      Reason for visit: Follow Up (1 month follow up /Wants to discuss lichen sclerous )      History of Present Illness:  Kari Gordon is a 77 y.o. female with GERD, hyperlipidemia, hypertension, osteoarthritis, sleep apnea, and vitamin D deficiency who presents for a follow up.    She recently had covid on 8/10 at [employer]. She has followed up with Dr. Sherilyn Banker.    Has a flare of her lichen sclerosis. She has some bleeding vaginally. She has been using clobetasol once per week.    She has been going to PT for different arthralgias. She fell and went to Dr. Cleophas Dunker and he sent her to PT. She has one leg that is 1 and 1/2 inches longer. She was having chronic hip pain on left. After an adjustment and therapy, her pain was improved. She opted not to use butrans. She still has it.    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    Abnormal cardiovascular stress test    UTI (urinary tract infection)    Alopecia    Insomnia    Herpes zoster    Dysuria    Type 2 diabetes, diet controlled (CMS HCC)    Hyperlipidemia LDL goal <70    CKD (chronic kidney disease), symptom management only, stage 2 (mild)    Vitamin D deficiency        Historical Data    Past Medical History:  Past Medical History:   Diagnosis Date    Alopecia 03/22/2022    Cancer (CMS HCC)     Diabetes mellitus, type 2 (CMS HCC)     Dysuria 03/22/2022    Esophageal reflux     Essential hypertension     Fibromyalgia  Herpes zoster 03/22/2022    Hx of breast cancer     Insomnia 03/22/2022    Nerve damage     Osteoarthritis     Sleep apnea     Type 2 diabetes, diet controlled (CMS HCC) 03/22/2022    Unspecified glaucoma(365.9)     Uterine cancer (CMS HCC)     UTI (urinary tract infection) 03/22/2022         Past Surgical History:  Past Surgical History:   Procedure Laterality Date    BILATERAL OOPHORECTOMY      COLONOSCOPY      dr Dewaine Conger 04/25/22 diverticular disease    EYE SURGERY      cornea and cataract surgery 12/29/21 left 05/04/22 right    HX BREAST LUMPECTOMY      HX CHOLECYSTECTOMY      HX HEART CATHETERIZATION      HX HYSTERECTOMY      LYMPH NODE BIOPSY      NERVE SURGERY      NOSE SURGERY      UTERINE FIBROID SURGERY           Allergies:  Allergies   Allergen Reactions    Hydrocodone-Acetaminophen Itching and Nausea/ Vomiting     headache  headache      Sulfa (Sulfonamides) Rash    Duloxetine Mental Status Effect    Erythromycin Nausea/ Vomiting     Medications:  Amlodipine-Valsartan 5-160 mg Oral Tablet, Take 1 Tablet by mouth Once a day  anastrozole (ARIMIDEX) 1 mg Oral Tablet, TAKE ONE TABLET BY MOUTH EVERY DAY  aspirin (ECOTRIN) 81 mg Oral Tablet,  Delayed Release (E.C.), Take 1 Tablet (81 mg total) by mouth  atorvastatin (LIPITOR) 40 mg Oral Tablet, Take 1 Tablet (40 mg total) by mouth Every evening  Clobetasol 0.05 % Gel, APPLY 1 APPLICATION TOPICALLY DAILY  cyclobenzaprine (FLEXERIL) 5 mg Oral Tablet, TAKE ONE TABLET BY MOUTH EVERY 8 HOURS AS NEEDED FOR MUSCLE SPASMS  famotidine (PEPCID) 40 mg Oral Tablet, Take 1 Tablet (40 mg total) by mouth Every evening  fremanezumab-vfrm (AJOVY AUTOINJECTOR) 225 mg/1.5 mL Subcutaneous Auto-Injector, Inject 1.5 mL (225 mg total) under the skin Every 30 days for 180 days  ketoconazole (NIZORAL) 2 % Cream, Apply topically  latanoprost (XALATAN) 0.005 % Ophthalmic Drops,   omeprazole (PRILOSEC) 20 mg Oral Capsule, Delayed Release(E.C.), Take 1 Capsule (20 mg total) by mouth Once a day  sennosides-docusate sodium (SENOKOT-S) 8.6-50 mg Oral Tablet, Take 2 Tablets by mouth Every night  sucralfate (CARAFATE) 1 gram Oral Tablet, Take 1 Tablet (1 g total) by mouth Four times a day - before meals and bedtime  SYNTHROID 25 mcg Oral Tablet, Take 1 Tablet (25 mcg total) by mouth Every morning  ubrogepant (UBRELVY) 100 mg Oral Tablet, Take by mouth One tab every 2 hr prn migraine  zolpidem (AMBIEN) 10 mg Oral Tablet, Take 1 Tablet (10 mg total) by mouth Every night as needed for Insomnia  clobetasoL (TEMOVATE) 0.05 % Cream, Apply topically    No facility-administered medications prior to visit.     Family History:  Family Medical History:       Problem Relation (Age of Onset)    Glaucoma Brother    Heart Disease Mother, Father    Hypertension (High Blood Pressure) Mother, Father    Stroke Mother, Father            Social History:  Social History     Socioeconomic History    Marital status: Married  Number of children: 2   Tobacco Use    Smoking status: Never    Smokeless tobacco: Never   Vaping Use    Vaping status: Never Used   Substance and Sexual Activity    Alcohol use: Never    Drug use: Never    Sexual activity: Not Currently      Partners: Male           Review of Systems:  Any pertinent Review of Systems as addressed in the HPI above.    Physical Exam:  Vital Signs:  Vitals:    07/31/23 0937   BP: 132/76   Pulse: (!) 106   Resp: 18   Temp: 37.4 C (99.4 F)   SpO2: 98%   Weight: 70.1 kg (154 lb 8 oz)   Height: 1.676 m (5\' 6" )   BMI: 24.99     Physical Exam  Vitals reviewed.   Constitutional:       General: She is not in acute distress.     Appearance: Normal appearance.   HENT:      Head: Normocephalic and atraumatic.      Mouth/Throat:      Pharynx: Posterior oropharyngeal erythema present.   Eyes:      General:         Right eye: No discharge.         Left eye: No discharge.   Cardiovascular:      Rate and Rhythm: Normal rate and regular rhythm.      Pulses: Normal pulses.      Heart sounds: Normal heart sounds.   Pulmonary:      Effort: Pulmonary effort is normal.      Breath sounds: Normal breath sounds.   Abdominal:      Palpations: Abdomen is soft.      Tenderness: There is no abdominal tenderness.   Genitourinary:     Labia:         Right: Rash present.         Left: Rash present.       Comments: Intertriginous space with underlying erythema    Has ulceration right labia with sclerosis  Neurological:      Mental Status: She is alert.             Weyman Pedro, DO     Portions of this note may be dictated using voice recognition software or a dictation service. Variances in spelling and vocabulary are possible and unintentional. Not all errors are caught/corrected. Please notify the Thereasa Parkin if any discrepancies are noted or if the meaning of any statement is not clear.

## 2023-08-17 ENCOUNTER — Telehealth (INDEPENDENT_AMBULATORY_CARE_PROVIDER_SITE_OTHER): Payer: Self-pay | Admitting: NURSE PRACTITIONER

## 2023-08-17 NOTE — Telephone Encounter (Signed)
Called pt back to discuss, would like to stay with Kari Gordon, next appt available is 10/09/23, she is agreeable to that, but would like to be placed on a call list to be seen sooner.

## 2023-08-17 NOTE — Telephone Encounter (Signed)
Copied from CRM #[id number]. Topic: Appointment Scheduling - New Appointment  >> Aug 17, 2023 11:26 AM Romilda Joy wrote:  Hunt, Kari Gordon called to schedule an appointment.     Pt would like to schedule Gordon follow up. Could you please call her back to discuss?    Thank you

## 2023-08-20 ENCOUNTER — Other Ambulatory Visit (INDEPENDENT_AMBULATORY_CARE_PROVIDER_SITE_OTHER): Payer: Self-pay | Admitting: NEUROLOGY

## 2023-08-20 NOTE — Telephone Encounter (Signed)
Pharmacy requesting refill.

## 2023-08-22 MED ORDER — UBRELVY 100 MG TABLET
1.0000 | ORAL_TABLET | ORAL | 2 refills | Status: DC | PRN
Start: 2023-08-22 — End: 2023-12-20

## 2023-08-27 ENCOUNTER — Telehealth (INDEPENDENT_AMBULATORY_CARE_PROVIDER_SITE_OTHER): Payer: Self-pay | Admitting: NEUROLOGY

## 2023-08-27 MED ORDER — AJOVY 225 MG/1.5 ML SUBCUTANEOUS AUTO-INJECTOR
225.0000 mg | AUTO-INJECTOR | SUBCUTANEOUS | 1 refills | Status: DC
Start: 2023-08-27 — End: 2024-02-08

## 2023-08-27 NOTE — Telephone Encounter (Signed)
Pt called requesting rx for the ajovy be sent to her mail in pharmacy [facility]. She said it will save her money to receive this by mail

## 2023-10-09 ENCOUNTER — Ambulatory Visit (INDEPENDENT_AMBULATORY_CARE_PROVIDER_SITE_OTHER): Payer: Self-pay | Admitting: NURSE PRACTITIONER

## 2023-10-09 ENCOUNTER — Other Ambulatory Visit (INDEPENDENT_AMBULATORY_CARE_PROVIDER_SITE_OTHER): Payer: Self-pay

## 2023-10-10 ENCOUNTER — Inpatient Hospital Stay (INDEPENDENT_AMBULATORY_CARE_PROVIDER_SITE_OTHER): Admission: RE | Admit: 2023-10-10 | Payer: Self-pay | Source: Ambulatory Visit

## 2023-10-10 ENCOUNTER — Other Ambulatory Visit: Payer: Self-pay

## 2023-10-10 ENCOUNTER — Encounter (INDEPENDENT_AMBULATORY_CARE_PROVIDER_SITE_OTHER): Payer: Self-pay | Admitting: NURSE PRACTITIONER

## 2023-10-10 ENCOUNTER — Ambulatory Visit: Payer: Medicare PPO | Attending: NURSE PRACTITIONER | Admitting: NURSE PRACTITIONER

## 2023-10-10 VITALS — BP 136/83 | HR 101 | Temp 98.0°F | Ht 66.0 in | Wt 157.2 lb

## 2023-10-10 DIAGNOSIS — Z170 Estrogen receptor positive status [ER+]: Secondary | ICD-10-CM

## 2023-10-10 DIAGNOSIS — Z79811 Long term (current) use of aromatase inhibitors: Secondary | ICD-10-CM | POA: Insufficient documentation

## 2023-10-10 DIAGNOSIS — C50412 Malignant neoplasm of upper-outer quadrant of left female breast: Secondary | ICD-10-CM

## 2023-10-10 DIAGNOSIS — Z08 Encounter for follow-up examination after completed treatment for malignant neoplasm: Secondary | ICD-10-CM | POA: Insufficient documentation

## 2023-10-10 DIAGNOSIS — Z853 Personal history of malignant neoplasm of breast: Secondary | ICD-10-CM | POA: Insufficient documentation

## 2023-10-10 DIAGNOSIS — Z9012 Acquired absence of left breast and nipple: Secondary | ICD-10-CM | POA: Insufficient documentation

## 2023-10-10 MED ORDER — ANASTROZOLE 1 MG TABLET
1.0000 mg | ORAL_TABLET | Freq: Every day | ORAL | 3 refills | Status: DC
Start: 2023-10-10 — End: 2024-10-14

## 2023-10-10 NOTE — Cancer Center Note (Signed)
Kari Gordon  [id number]  1946/11/01   10/10/2023       Department of Hematology/Oncology  Return Patient Visit           REFERRING PROVIDER:  Weyman Pedro, DO  [address]  [place],  [place] [address]      REASON FOR OFFICE VISIT:  Ongoing management and evaluation of  Left breast cancer      HISTORY OF PRESENT ILLNESS:  Kari Gordon is a 77 y.o. female who presents to today alone for evaluation of left breast cancer.    She reports she  has been doing better than she had been.  She had Trigeminal nerve surgery in 2022 and then two cornea replacements in 2023.   She had a mammogram with Dr. Dewaine Conger this year and it benign.   She reports she has only missed 1-3 doses of her Anastrozole.        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  Negative for chills and fever.   HENT:  Negative.     Eyes: Negative.    Respiratory: Negative.  Negative for shortness of breath.    Cardiovascular: Negative.  Negative for chest pain.   Gastrointestinal: Negative.  Negative for diarrhea, nausea and vomiting.   Genitourinary:  Negative for difficulty urinating.    Musculoskeletal: Negative.  Negative for arthralgias.   Skin: Negative.    Neurological:  Positive for dizziness (Due to her Trigeminal nerve surgery) and headaches (migraines).   Hematological:  Negative for adenopathy. Does not bruise/bleed easily.   Psychiatric/Behavioral: Negative.          Past Medical History:   Diagnosis Date    Alopecia 03/22/2022    Cancer (CMS HCC)     Diabetes mellitus, type 2 (CMS HCC)     Dysuria 03/22/2022    Esophageal reflux     Essential hypertension     Fibromyalgia     Herpes zoster 03/22/2022    Hx of breast cancer     Insomnia 03/22/2022    Nerve damage     Osteoarthritis      Sleep apnea     Type 2 diabetes, diet controlled (CMS HCC) 03/22/2022    Unspecified glaucoma(365.9)     Uterine cancer (CMS HCC)     UTI (urinary tract infection) 03/22/2022           Social History     Socioeconomic History    Marital status: Married     Spouse name: Not on file    Number of children: 2    Years of education: Not on file    Highest education level: Not on file   Occupational History    Not on file   Tobacco Use    Smoking status: Never    Smokeless tobacco: Never   Vaping Use    Vaping status: Never Used   Substance and Sexual Activity    Alcohol use: Never    Drug use: Never    Sexual activity: Not Currently     Partners: Male   Other Topics Concern    Not on file   Social History Narrative    Not on file     Social Determinants of Health  Financial Resource Strain: Not on file   Transportation Needs: Not on file   Social Connections: Not on file   Intimate Partner Violence: Not on file   Housing Stability: Not on file       Social History     Social History Narrative    Not on file       Social History     Substance and Sexual Activity   Drug Use Never       Family Medical History:       Problem Relation (Age of Onset)    Glaucoma Brother    Heart Disease Mother, Father    Hypertension (High Blood Pressure) Mother, Father    Stroke Mother, Father              Current Outpatient Medications   Medication Sig    Amlodipine-Valsartan 5-160 mg Oral Tablet Take 1 Tablet by mouth Once a day    anastrozole (ARIMIDEX) 1 mg Oral Tablet Take 1 Tablet (1 mg total) by mouth Once a day    aspirin (ECOTRIN) 81 mg Oral Tablet, Delayed Release (E.C.) Take 1 Tablet (81 mg total) by mouth    atorvastatin (LIPITOR) 40 mg Oral Tablet Take 1 Tablet (40 mg total) by mouth Every evening    cholecalciferol, vitamin D3, 25 mcg (1,000 unit) Oral Tablet Take 1 Tablet (1,000 Units total) by mouth Once a day    Clobetasol 0.05 % Gel APPLY 1 APPLICATION TOPICALLY DAILY    Coenzyme Q10 (CO Q-10) 10 mg Oral Capsule Take by  mouth    cyclobenzaprine (FLEXERIL) 5 mg Oral Tablet TAKE ONE TABLET BY MOUTH EVERY 8 HOURS AS NEEDED FOR MUSCLE SPASMS    famotidine (PEPCID) 40 mg Oral Tablet Take 1 Tablet (40 mg total) by mouth Every evening    fremanezumab-vfrm (AJOVY AUTOINJECTOR) 225 mg/1.5 mL Subcutaneous Auto-Injector Inject 1.5 mL (225 mg total) under the skin Every 30 days for 180 days    ketoconazole (NIZORAL) 2 % Cream Apply topically    latanoprost (XALATAN) 0.005 % Ophthalmic Drops     omeprazole (PRILOSEC) 20 mg Oral Capsule, Delayed Release(E.C.) Take 1 Capsule (20 mg total) by mouth Once a day    prednisoLONE acetate (PRED FORTE) 1 % Ophthalmic Drops, Suspension     Resorcinol-Petro-[place]a-Zinc-Lan (RESINOL) 2-55 % Ointment Apply topically Three times a day    sennosides-docusate sodium (SENOKOT-S) 8.6-50 mg Oral Tablet Take 2 Tablets by mouth Every night    sucralfate (CARAFATE) 1 gram Oral Tablet Take 1 Tablet (1 g total) by mouth Four times a day - before meals and bedtime    SYNTHROID 25 mcg Oral Tablet Take 1 Tablet (25 mcg total) by mouth Every morning    ubrogepant (UBRELVY) 100 mg Oral Tablet Take 1 Tablet (100 mg total) by mouth Every 24 hours as needed (for migraine) for up to 90 days    zolpidem (AMBIEN) 10 mg Oral Tablet Take 1 Tablet (10 mg total) by mouth Every night as needed for Insomnia       Allergies   Allergen Reactions    Hydrocodone-Acetaminophen Itching and Nausea/ Vomiting     headache  headache      Sulfa (Sulfonamides) Rash    Duloxetine Mental Status Effect    Erythromycin Nausea/ Vomiting         PHYSICAL EXAM:  BP 136/83 (Site: Right Arm, Patient Position: Sitting, Cuff Size: Adult)   Pulse (!) 101   Temp 36.7 C (98 F) (Temporal)  Ht 1.676 m (5\' 6" )   Wt 71.3 kg (157 lb 3.2 oz)   BMI 25.37 kg/m        ECOG Status: (0) Fully active, able to carry on all predisease performance without restriction   Physical Exam  Constitutional:       Appearance: Normal appearance.   Cardiovascular:      Rate and  Rhythm: Normal rate and regular rhythm.      Pulses: Normal pulses.      Heart sounds: Normal heart sounds.   Pulmonary:      Effort: Pulmonary effort is normal.      Breath sounds: Normal breath sounds.   Chest:          Comments: Right breast exam without evidence of disease.   Left breast with changes consistent with surgery   Abdominal:      Palpations: Abdomen is soft.   Lymphadenopathy:      Head:      Right side of head: No submental, submandibular, tonsillar, preauricular, posterior auricular or occipital adenopathy.      Left side of head: No submental, submandibular, tonsillar, preauricular, posterior auricular or occipital adenopathy.      Cervical: No cervical adenopathy.      Right cervical: No superficial, deep or posterior cervical adenopathy.     Left cervical: No superficial, deep or posterior cervical adenopathy.      Upper Body:      Right upper body: No supraclavicular or axillary adenopathy.      Left upper body: No supraclavicular or axillary adenopathy.   Skin:     General: Skin is warm and dry.   Neurological:      General: No focal deficit present.      Mental Status: She is alert and oriented to person, place, and time. Mental status is at baseline.            RADIOLOGY:       LABS:   CBC  Diff   Lab Results   Component Value Date/Time    WBC 6.4 05/24/2023 08:08 AM    HGB 12.9 05/24/2023 08:08 AM    HCT 37.5 05/24/2023 08:08 AM    PLTCNT 192 05/24/2023 08:08 AM    RBC 3.97 05/24/2023 08:08 AM    MCV 94.3 05/24/2023 08:08 AM    MCHC 34.6 05/24/2023 08:08 AM    MCH 32.6 05/24/2023 08:08 AM    RDW 13.0 05/24/2023 08:08 AM    MPV 8.0 05/24/2023 08:08 AM    Lab Results   Component Value Date/Time    PMNS 71 12/13/2022 10:19 AM    LYMPHOCYTES 15 (L) 12/13/2022 10:19 AM    EOSINOPHIL 3 12/13/2022 10:19 AM    MONOCYTES 12 12/13/2022 10:19 AM    BASOPHILS 0 12/13/2022 10:19 AM    BASOPHILS 0.00 12/13/2022 10:19 AM    PMNABS 4.80 12/13/2022 10:19 AM    LYMPHSABS 1.00 12/13/2022 10:19 AM    EOSABS  0.20 12/13/2022 10:19 AM    MONOSABS 0.80 12/13/2022 10:19 AM            Comprehensive Metabolic Profile    Lab Results   Component Value Date    SODIUM 140 05/24/2023    POTASSIUM 3.9 05/24/2023    CHLORIDE 105 05/24/2023    CO2 28 05/24/2023    ANIONGAP 7 05/24/2023    BUN 12 05/24/2023    CREATININE 0.97 05/24/2023    ALBUMIN 4.4 05/24/2023    CALCIUM 10.1  05/24/2023    GLUCOSENF 162 (H) 05/24/2023    ALKPHOS 100 05/24/2023    ALT 14 05/24/2023    AST 18 05/24/2023    TOTBILIRUBIN 0.8 05/24/2023    TOTALPROTEIN 7.4 05/24/2023         ESTIMATED GFR   Date Value Ref Range Status   05/24/2023 61 >59 mL/min/1.48m^2 Final           ASSESSMENT:    ICD-10-CM    1. Malignant neoplasm of upper-outer quadrant of left breast in female, estrogen receptor positive (CMS HCC)  C50.412 CA 27,29    Z17.0 anastrozole (ARIMIDEX) 1 mg Oral Tablet    Diagnosed 2017, ER PR positive HER2 negative T1c N0 M0      2. Long term (current) use of aromatase inhibitors  Z79.811 CA 27,29     anastrozole (ARIMIDEX) 1 mg Oral Tablet             PLAN:   1. Infiltrating ductal carcinoma left breast:  Diagnosed in 2017, treated with lumpectomy.  Patient has been on anastrozole 1 mg p.o. daily since that time.  Most recent mammogram was benign.  No evidence of disease at this time.  Patient will have a CA 27.29 completed with her next lab work by her PCP.   Return in about 1 year (around 10/09/2024).     Kari Gordon was given the chance to ask questions, and these were answered to their satisfaction. The patient is welcome to call with any questions or concerns in the meantime.     On the day of the encounter, I spent a total of  48 minutes on this patient encounter including review of historical information, examination, documentation and post-visit activities.     Marvene Staff APRN, FNP-BC, AOCNP, 10/10/2023 , 10:08     You can see your note(s) in [facility]. It is common for you to encounter certain medical terminology which may be unfamiliar  to you. You might see results before your provider does so please give at least 2 business days for review. Please have this understanding, that NOT all abnormal results are significant. Our office will contact you for any urgent or emergent action if necessary. If you have any questions or concerns, feel free to send a MyChart message or call the office. Please call with any new or concerning symptoms.       This note was partially generated using MModal Fluency Direct system, and there may be some incorrect words, spellings, and punctuation that were not noted in checking the note before saving.     CCWeyman Pedro, DO  [address]  [place] [address]

## 2023-10-21 ENCOUNTER — Emergency Department
Admission: EM | Admit: 2023-10-21 | Discharge: 2023-10-21 | Disposition: A | Discharge status: Home / Self Care | Payer: Medicare PPO | Attending: Emergency Medicine | Admitting: Emergency Medicine

## 2023-10-21 ENCOUNTER — Encounter (HOSPITAL_COMMUNITY): Payer: Self-pay | Admitting: Emergency Medicine

## 2023-10-21 ENCOUNTER — Other Ambulatory Visit: Payer: Self-pay

## 2023-10-21 ENCOUNTER — Emergency Department (HOSPITAL_COMMUNITY): Payer: Medicare PPO

## 2023-10-21 DIAGNOSIS — M940 Chondrocostal junction syndrome [Tietze]: Secondary | ICD-10-CM | POA: Insufficient documentation

## 2023-10-21 DIAGNOSIS — I10 Essential (primary) hypertension: Secondary | ICD-10-CM | POA: Insufficient documentation

## 2023-10-21 DIAGNOSIS — R079 Chest pain, unspecified: Secondary | ICD-10-CM

## 2023-10-21 LAB — ECG 12 LEAD
Atrial Rate: 100 {beats}/min
Calculated P Axis: 59 degrees
Calculated R Axis: 61 degrees
Calculated T Axis: 68 degrees
PR Interval: 208 ms
QRS Duration: 82 ms
QT Interval: 352 ms
QTC Calculation: 454 ms
Ventricular rate: 100 {beats}/min

## 2023-10-21 LAB — COMPREHENSIVE METABOLIC PANEL, NON-FASTING
ALBUMIN/GLOBULIN RATIO: 1.4 (ref 0.8–1.4)
ALBUMIN: 4.2 g/dL (ref 3.5–5.7)
ALKALINE PHOSPHATASE: 61 U/L (ref 34–104)
ALT (SGPT): 19 U/L (ref 7–52)
ANION GAP: 9 mmol/L (ref 4–13)
AST (SGOT): 16 U/L (ref 13–39)
BILIRUBIN TOTAL: 0.9 mg/dL (ref 0.3–1.0)
BUN/CREA RATIO: 13 (ref 6–22)
BUN: 12 mg/dL (ref 7–25)
CALCIUM, CORRECTED: 9.8 mg/dL (ref 8.9–10.8)
CALCIUM: 10 mg/dL (ref 8.6–10.3)
CHLORIDE: 103 mmol/L (ref 98–107)
CO2 TOTAL: 24 mmol/L (ref 21–31)
CREATININE: 0.9 mg/dL (ref 0.60–1.30)
ESTIMATED GFR: 66 mL/min/{1.73_m2} (ref >59)
GLOBULIN: 3 (ref 2.0–3.5)
GLUCOSE: 169 mg/dL — ABNORMAL HIGH (ref 74–109)
OSMOLALITY, CALCULATED: 276 mosm/kg (ref 270–290)
POTASSIUM: 3.8 mmol/L (ref 3.5–5.1)
PROTEIN TOTAL: 7.2 g/dL (ref 6.4–8.9)
SODIUM: 136 mmol/L (ref 136–145)

## 2023-10-21 LAB — CBC WITH DIFF
BASOPHIL #: 0 10*3/uL (ref 0.00–0.10)
BASOPHIL %: 0 % (ref 0–1)
EOSINOPHIL #: 0.1 10*3/uL (ref 0.00–0.50)
EOSINOPHIL %: 1 % (ref 1–7)
HCT: 37.2 % (ref 31.2–41.9)
HGB: 13.1 g/dL (ref 10.9–14.3)
LYMPHOCYTE #: 0.8 10*3/uL — ABNORMAL LOW (ref 1.00–3.00)
LYMPHOCYTE %: 8 % — ABNORMAL LOW (ref 16–44)
MCH: 33.3 pg — ABNORMAL HIGH (ref 24.7–32.8)
MCHC: 35.1 g/dL (ref 32.3–35.6)
MCV: 94.9 fL (ref 75.5–95.3)
MONOCYTE #: 1.3 10*3/uL — ABNORMAL HIGH (ref 0.30–1.00)
MONOCYTE %: 13 % (ref 5–13)
MPV: 7.6 fL — ABNORMAL LOW (ref 7.9–10.8)
NEUTROPHIL #: 7.6 10*3/uL (ref 1.85–7.80)
NEUTROPHIL %: 78 % — ABNORMAL HIGH (ref 43–77)
PLATELETS: 165 10*3/uL (ref 140–440)
RBC: 3.92 10*6/uL (ref 3.63–4.92)
RDW: 13.1 % (ref 12.3–17.7)
WBC: 9.8 10*3/uL (ref 3.8–11.8)

## 2023-10-21 LAB — TROPONIN-I
TROPONIN I: 3 ng/L (ref <15)
TROPONIN I: <2 ng/L (ref <15)

## 2023-10-21 LAB — PT/INR
INR: 1.02 (ref 0.84–1.10)
PROTHROMBIN TIME: 11.9 s (ref 9.8–12.7)

## 2023-10-21 LAB — PTT (PARTIAL THROMBOPLASTIN TIME): APTT: 27.8 s (ref 25.0–38.0)

## 2023-10-21 LAB — MAGNESIUM: MAGNESIUM: 1.8 mg/dL — ABNORMAL LOW (ref 1.9–2.7)

## 2023-10-21 MED ORDER — ONDANSETRON 4 MG DISINTEGRATING TABLET
ORAL_TABLET | ORAL | Status: AC
Start: 2023-10-21 — End: 2023-10-21
  Filled 2023-10-21: qty 1

## 2023-10-21 MED ORDER — TRAMADOL 50 MG TABLET
1.0000 | ORAL_TABLET | Freq: Four times a day (QID) | ORAL | 0 refills | Status: DC | PRN
Start: 2023-10-21 — End: 2023-10-31

## 2023-10-21 MED ORDER — BUPRENORPHINE HCL 0.3 MG/ML INJECTION SOLUTION
0.3000 mg | INTRAMUSCULAR | Status: AC
Start: 2023-10-21 — End: 2023-10-21
  Administered 2023-10-21: 0.3 mg via INTRAMUSCULAR

## 2023-10-21 MED ORDER — BUPRENORPHINE HCL 0.3 MG/ML INJECTION SOLUTION
INTRAMUSCULAR | Status: AC
Start: 2023-10-21 — End: 2023-10-21
  Filled 2023-10-21: qty 1

## 2023-10-21 MED ORDER — PREDNISONE 20 MG TABLET
ORAL_TABLET | ORAL | Status: AC
Start: 2023-10-21 — End: 2023-10-21
  Filled 2023-10-21: qty 1

## 2023-10-21 MED ORDER — ONDANSETRON 4 MG DISINTEGRATING TABLET
4.0000 mg | ORAL_TABLET | ORAL | Status: AC
Start: 2023-10-21 — End: 2023-10-21
  Administered 2023-10-21: 4 mg via ORAL

## 2023-10-21 NOTE — ED Nurses Note (Signed)
Oriented times four. Discharge instructions provided and reviewed including new scripts. Patient instructed to follow up with PCP and return to ER with any complications. Verbalizes understanding. No PIV to remove. Respirations even and nonlabored. Denies pain. Patient left this facility via wheelchair, accompanied by a female family member, and in care of self. Care relinquished at this time.

## 2023-10-21 NOTE — ED Triage Notes (Signed)
Sternal Chest pain since yesterday morning. Has taken 325 mg aspirin. Hx of heart cath performed at [facility] but no stents placed. States hx of of mitral valve prolapse.

## 2023-10-21 NOTE — ED Attending Handoff Note (Signed)
[facility] - Emergency Department  Emergency Department  Course Note      Kari Gordon is a 77 y.o. female who had concerns including Chest Pain .     I accepted this patient in transfer of care from Madelaine Bhat at 07:30    I personally evaluated and examined the patient at the time of the visit.     Lab review: All labs reviewed by me at time of visit.   Results for orders placed or performed during the hospital encounter of 10/21/23 (from the past 24 hour(s))   CBC/DIFF    Narrative    The following orders were created for panel order CBC/DIFF.  Procedure                               Abnormality         Status                     ---------                               -----------         ------                     CBC WITH [id number]]                Abnormal            Final result                 Please view results for these tests on the individual orders.   COMPREHENSIVE METABOLIC PANEL, NON-FASTING   Result Value Ref Range    SODIUM 136 136 - 145 mmol/L    POTASSIUM 3.8 3.5 - 5.1 mmol/L    CHLORIDE 103 98 - 107 mmol/L    CO2 TOTAL 24 21 - 31 mmol/L    ANION GAP 9 4 - 13 mmol/L    BUN 12 7 - 25 mg/dL    CREATININE 6.21 3.08 - 1.30 mg/dL    BUN/CREA RATIO 13 6 - 22    ESTIMATED GFR 66 >59 mL/min/1.94m^2    ALBUMIN 4.2 3.5 - 5.7 g/dL    CALCIUM 65.7 8.6 - 84.6 mg/dL    GLUCOSE 962 (H) 74 - 109 mg/dL    ALKALINE PHOSPHATASE 61 34 - 104 U/L    ALT (SGPT) 19 7 - 52 U/L    AST (SGOT) 16 13 - 39 U/L    BILIRUBIN TOTAL 0.9 0.3 - 1.0 mg/dL    PROTEIN TOTAL 7.2 6.4 - 8.9 g/dL    ALBUMIN/GLOBULIN RATIO 1.4 0.8 - 1.4    OSMOLALITY, CALCULATED 276 270 - 290 mOsm/kg    CALCIUM, CORRECTED 9.8 8.9 - 10.8 mg/dL    GLOBULIN 3.0 2.0 - 3.5    Narrative    Estimated Glomerular Filtration Rate (eGFR) is calculated using the CKD-EPI (2021) equation, intended for patients 69 years of age and older. If gender is not documented or "unknown", there will be no eGFR calculation.     TROPONIN-I NOW   Result  Value Ref Range    TROPONIN I 3 <15 ng/L   CBC WITH DIFF   Result Value Ref Range    WBC 9.8 3.8 - 11.8 x10^3/uL    RBC 3.92 3.63 - 4.92 x10^6/uL  HGB 13.1 10.9 - 14.3 g/dL    HCT 13.0 86.5 - 78.4 %    MCV 94.9 75.5 - 95.3 fL    MCH 33.3 (H) 24.7 - 32.8 pg    MCHC 35.1 32.3 - 35.6 g/dL    RDW 69.6 29.5 - 28.4 %    PLATELETS 165 140 - 440 x10^3/uL    MPV 7.6 (L) 7.9 - 10.8 fL    NEUTROPHIL % 78 (H) 43 - 77 %    LYMPHOCYTE % 8 (L) 16 - 44 %    MONOCYTE % 13 5 - 13 %    EOSINOPHIL % 1 1 - 7 %    BASOPHIL % 0 0 - 1 %    NEUTROPHIL # 7.60 1.85 - 7.80 x10^3/uL    LYMPHOCYTE # 0.80 (L) 1.00 - 3.00 x10^3/uL    MONOCYTE # 1.30 (H) 0.30 - 1.00 x10^3/uL    EOSINOPHIL # 0.10 0.00 - 0.50 x10^3/uL    BASOPHIL # 0.00 0.00 - 0.10 x10^3/uL   MAGNESIUM   Result Value Ref Range    MAGNESIUM 1.8 (L) 1.9 - 2.7 mg/dL   PT/INR   Result Value Ref Range    PROTHROMBIN TIME 11.9 9.8 - 12.7 seconds    INR 1.02 0.84 - 1.10    Narrative    In the setting of warfarin therapy, a moderate-intensity INR goal range is 2.0 to 3.0 and a high-intensity INR goal range is 2.5 to 3.5.    INR is ONLY validated to determine the level of anticoagulation with vitamin K antagonists (warfarin). Other factors may elevate the INR including but not limited to direct oral anticoagulants (DOACs), liver dysfunction, vitamin K deficiency, DIC, factor deficiencies, and factor inhibitors.   PTT (PARTIAL THROMBOPLASTIN TIME)   Result Value Ref Range    APTT 27.8 25.0 - 38.0 seconds   TROPONIN-I IN ONE HOUR   Result Value Ref Range    TROPONIN I <2 <15 ng/L        Radiology Review: All radiologic testing viewed by me at the time of visit.   XR CHEST PA AND LATERAL   Final Result by Edi, Radresults In (11/17 0736)   NO ACUTE FINDINGS.         Radiologist location ID: [id number]                  Course:   Medical Decision Making  Problems Addressed:  Chest pain, unspecified type: acute illness or injury  Costochondritis, acute: acute illness or injury    Amount and/or  Complexity of Data Reviewed  Labs:  Decision-making details documented in ED Course.  Radiology:  Decision-making details documented in ED Course.  ECG/medicine tests:  Decision-making details documented in ED Course.    Risk  Prescription drug management.  Parenteral controlled substances.      ED Course as of 10/21/23 0916   Sun Oct 21, 2023   0908 TROPONIN-I IN ONE HOUR   [phone] ECG 12 LEAD   0909 XR CHEST PA AND LATERAL   0909 MAGNESIUM(!): 1.8   0909 PT/INR   0909 PTT (PARTIAL THROMBOPLASTIN TIME)   0909 TROPONIN-I IN ONE HOUR   4034 COMPREHENSIVE METABOLIC PANEL, NON-FASTING(!)   0909 CBC/DIFF(!)     Clinical Impression   Chest pain, unspecified type (Primary)   Costochondritis, acute       Following the history, physical exam, and ED workup, the patient was deemed stable and suitable for discharge. The patient/caregiver was advised  to return to the ED for any new or worsening symptoms. Discharge medications, and follow-up instructions were discussed with the patient/caregiver in detail, who verbalizes understanding. The patient/caregiver is in agreement and is comfortable with the plan of care.    Disposition: Discharged         Current Discharge Medication List        START taking these medications.        Details   traMADoL 50 mg Tablet  Commonly known as: ULTRAM   50 mg, Oral, EVERY 6 HOURS PRN  Qty: 12 Tablet  Refills: 0            CONTINUE these medications - NO CHANGES were made during your visit.        Details   Ajovy Autoinjector 225 mg/1.5 mL Auto-Injector  Generic drug: fremanezumab-vfrm   225 mg, Subcutaneous, EVERY 30 DAYS  Qty: 4.5 mL  Refills: 1     Amlodipine-Valsartan 5-160 mg Tablet   1 Tablet, Oral, DAILY  Qty: 30 Tablet  Refills: 0     anastrozole 1 mg Tablet  Commonly known as: ARIMIDEX   1 mg, Oral, DAILY  Qty: 90 Tablet  Refills: 3     aspirin 81 mg Tablet, Delayed Release (E.C.)  Commonly known as: ECOTRIN   81 mg, Oral  Refills: 0     atorvastatin 40 mg Tablet  Commonly known as:  LIPITOR   40 mg, Oral, EVERY EVENING  Qty: 90 Tablet  Refills: 3     cholecalciferol (vitamin D3) 25 mcg (1,000 unit) Tablet   1,000 Units, Oral, DAILY  Refills: 0     Clobetasol 0.05 % Gel   APPLY 1 APPLICATION TOPICALLY DAILY  Qty: 30 g  Refills: 11     Co Q-10 10 mg Capsule  Generic drug: Coenzyme Q10   Oral  Refills: 0     cyclobenzaprine 5 mg Tablet  Commonly known as: FLEXERIL   5 mg, Oral, EVERY 8 HOURS PRN  Qty: 90 Tablet  Refills: 0     famotidine 40 mg Tablet  Commonly known as: PEPCID   40 mg, Oral, EVERY EVENING  Qty: 90 Tablet  Refills: 3     ketoconazole 2 % Cream  Commonly known as: NIZORAL   Topical  Refills: 0     latanoprost 0.005 % Drops  Commonly known as: XALATAN   No dose, route, or frequency recorded.  Refills: 0     omeprazole 20 mg Capsule, Delayed Release(E.C.)  Commonly known as: PRILOSEC   20 mg, Oral, DAILY  Qty: 90 Capsule  Refills: 3     prednisoLONE acetate 1 % Drops, Suspension  Commonly known as: PRED FORTE   Refills: 0     ResinoL 2-55 % Ointment  Generic drug: Resorcinol-Petro-Cala-Zinc-Lan   Apply externally, 3 TIMES DAILY  Qty: 50 g  Refills: 3     sennosides-docusate sodium 8.6-50 mg Tablet  Commonly known as: SENOKOT-S   2 Tablets, Oral, NIGHTLY  Qty: 180 Tablet  Refills: 3     sucralfate 1 gram Tablet  Commonly known as: CARAFATE   1 g, Oral, 4 TIMES DAILY - BEFORE MEALS AND NIGHTLY  Qty: 60 Tablet  Refills: 2     Synthroid 25 mcg Tablet  Generic drug: levothyroxine   25 mcg, Oral, EVERY MORNING  Qty: 90 Tablet  Refills: 3     Ubrelvy 100 mg Tablet  Generic drug: ubrogepant   100 mg, Oral, EVERY 24 HOURS  PRN  Qty: 9 Tablet  Refills: 2     zolpidem 10 mg Tablet  Commonly known as: AMBIEN   10 mg, Oral, NIGHTLY PRN  Qty: 90 Tablet  Refills: 1            Follow up:   No follow-up provider specified.          Alphonzo Cruise, DO

## 2023-10-21 NOTE — Discharge Instructions (Addendum)
See your primary care provider within 1 week   Ultram every 6 hours needed for severe pain.  Do not take with [place] .   Return to the ER for any emergencies   If you continue to have this pain, you should see a cardiologist from list provided below.  Continue all your normal medication at home as prescribed by your doctor.    Return to the ER for any emergencies you may have.    Cardiologist List    Cindie Crumbly,  MD  [facility]  [address]  [place], [place] [address]  [phone]    Naeem A. Lana Fish, MD, [facility]  [address] [facility]  [place], [place] [address]  [phone]    Lonzo Cloud, MD, [facility]  [address] [place], [place] [address]  [phone]    Dorcas Mcmurray, MD  [address]  [place], [place] [address]  [phone]    Maryella Shivers, MD  [address]  [place], [place] [address]  [phone]    Elam Dutch, MD  [address]  [place], [place] [address]  [phone]

## 2023-10-21 NOTE — ED Provider Notes (Addendum)
[facility]  ED Primary Provider Note        Arrival: The patient arrived by Car     History of Present Illness   chief complaint  Kari Gordon is a 77 y.o. female who had concerns including Chest Pain .  64 female presents emergency room with a chief complaint of chest pain.  Onset was yesterday morning.  She describes it as sharp/heaviness.  Symptoms are significantly worse with deep breath.  Currently 6/10.  The discomfort does radiate up to the right shoulder.  No shortness a breath.  Did have nonproductive cough over the past week.  Patient states "I thought it is coming down with a cold".  She is did have COVID in August of 2024.  Patient denying any history of coronary artery disease.  She does have a history of breast cancer with lumpectomy.  She is on Arimidex.  Patient is report history of hypertension and diabetes mellitus type 2.  She did have cardiac catheterization which showed no "blockages" in 2009 at [facility].  She has had no leg or calf tenderness.  She did take aspirin 325 p.o. this morning.  Blood pressure 154/73 with a heart rate of 100, respiratory rate of 20 with an oxygen saturation 99% on room air.  Afebrile at 98.0.  All nursing notes reviewed        Review of Systems     No other overt Review of Systems are noted to be positive except noted in the HPI.      Historical Data   History Reviewed This Encounter: Medical History  Surgical History  Family History  Social History      Physical Exam   ED Triage Vitals   BP (Non-Invasive) 10/21/23 0515 (!) 154/73   Heart Rate 10/21/23 0515 100   Respiratory Rate 10/21/23 0515 20   Temperature 10/21/23 0515 36.7 C (98 F)   SpO2 10/21/23 0515 99 %   Weight 10/21/23 0720 71.2 kg (157 lb)   Height 10/21/23 0720 1.676 m (5' 5.98")         Exam:   Constitutional:  Patient alert orient x3 in no apparent distress.  No limitations.  Head: Atraumatic normocephalic  Eyes :  Pupils are equal round reactive to  light and accommodation extraocular muscles are intact.  Sclera and conjunctiva are unremarkable  Ears:  Tympanic membranes are pearly gray bilaterally; external auditory canals are unremarkable; external ears without any lesions  Nose:  Nares are patent turbinates are pink and moist  Mouth:  Mucosa is pink and moist without lesions.  Posterior pharynx is pink and moist without hypertrophy/exudate.  Neck:  Soft and supple without palpable lymphadenopathy.  Heart:  Regular rate and rhythm with a 1/6 holosystolic ejection murmur   Lungs:  Clear to auscultation bilaterally without any wheezing/rales/rhonchi  Abdomen:  Soft nontender without any rebound or guarding; positive bowel sounds throughout  Genitalia:  Deferred  Skin:  Warm and dry without lesions.  Normal skin turgor.  Brisk capillary refill distally  Extremities:  Good strength bilaterally with full range of motion of upper and lower extremities.  No leg or calf tenderness.  No edema  Neuro:  Alert oriented x3.  Cranial nerves II-XII grossly intact as tested.  Excellent sensation distally over all dermatomes.  Psychiatric:  Patient cooperative, affect appropriate, insight and judgment good          Procedures  Patient Data     Labs Ordered/Reviewed   COMPREHENSIVE METABOLIC PANEL, NON-FASTING - Abnormal; Notable for the following components:       Result Value    GLUCOSE 169 (*)     All other components within normal limits    Narrative:     Estimated Glomerular Filtration Rate (eGFR) is calculated using the CKD-EPI (2021) equation, intended for patients 39 years of age and older. If gender is not documented or "unknown", there will be no eGFR calculation.     CBC WITH DIFF - Abnormal; Notable for the following components:    MCH 33.3 (*)     MPV 7.6 (*)     NEUTROPHIL % 78 (*)     LYMPHOCYTE % 8 (*)     LYMPHOCYTE # 0.80 (*)     MONOCYTE # 1.30 (*)     All other components within normal limits   MAGNESIUM - Abnormal; Notable for the following  components:    MAGNESIUM 1.8 (*)     All other components within normal limits   TROPONIN-I - Normal   PT/INR - Normal    Narrative:     In the setting of warfarin therapy, a moderate-intensity INR goal range is 2.0 to 3.0 and a high-intensity INR goal range is 2.5 to 3.5.    INR is ONLY validated to determine the level of anticoagulation with vitamin K antagonists (warfarin). Other factors may elevate the INR including but not limited to direct oral anticoagulants (DOACs), liver dysfunction, vitamin K deficiency, DIC, factor deficiencies, and factor inhibitors.   PTT (PARTIAL THROMBOPLASTIN TIME) - Normal   CBC/DIFF    Narrative:     The following orders were created for panel order CBC/DIFF.  Procedure                               Abnormality         Status                     ---------                               -----------         ------                     CBC WITH ZOXW[[id number]]                Abnormal            Final result                 Please view results for these tests on the individual orders.   TROPONIN-I   TROPONIN-I       XR CHEST PA AND LATERAL   Final Result by Edi, Radresults In (11/17 0736)   NO ACUTE FINDINGS.         Radiologist location ID: [id number]             Medical Decision Making          Medical Decision Making  Glucose 169 otherwise CMP unremarkable.  Initial troponin is negative.  Two-view chest shows no acute infiltrate, no acute disease process, PT/INR/PTT are within normal range.  CBC unremarkable.  EKG shows normal sinus rhythm with a heart rate of 100.  No ischemia.  Patient had already taken  aspirin 325 orally at home.  Patient ordered buprenorphine 0.3 mg IM.  Patient states is had significant relief with the buprenorphine is 0.3 mg IM.  We are still awaiting 2nd draw of 2nd troponin.  Patient be signed out to Dr. Alphonzo Cruise at 7:30 a.m..  I anticipate patient be able to go home.    Amount and/or Complexity of Data Reviewed  Labs: ordered. Decision-making details  documented in ED Course.  Radiology: ordered and independent interpretation performed. Decision-making details documented in ED Course.  ECG/medicine tests: ordered and independent interpretation performed. Decision-making details documented in ED Course.    Risk  Prescription drug management.  Parenteral controlled substances.    Critical Care  Total time providing critical care: 0 minutes        ED Course as of 10/21/23 0737   Sun Oct 21, 2023   4098 EKG shows normal sinus rhythm with a heart rate of 100, normal axis, normal R-wave progression, no ectopy, no ischemia, normal PR/QRS interval.   0609 CBC unremarkable   0620 PT/INR/PTT are all within normal range.   1191 Two-view chest shows no acute infiltrate, no acute disease process   0648 Initial troponin negative   0648 Glucose 169 otherwise CMP unremarkable.  Magnesium 1.8         Medications Administered in the ED   buprenorphine (BUPRENEX) 0.3 mg/mL injection (0.3 mg IntraMUSCULAR Given 10/21/23 0657)                      Clinical Impression   Chest pain, unspecified type (Primary)   Costochondritis, acute         Current Discharge Medication List          R.A. Santiago Bumpers, DO  Department of Emergency Medicine

## 2023-10-22 ENCOUNTER — Telehealth (HOSPITAL_COMMUNITY): Payer: Self-pay | Admitting: Family Medicine

## 2023-10-22 NOTE — Telephone Encounter (Signed)
Post Ed Follow-Up    Post ED Follow-Up:   Document completed and/or attempted interactive contact(s) after transition to home after emergency department stay.:   Transition Facility and relevant Date:   Discharge Date: 10/21/23  Discharge from [facility] Emergency Department?: Yes  Discharge Facility: [facility]  Contacted by: Lum Babe, RN  Contact method: Patient/Caregiver Telephone  Contact completed: 10/22/2023  1:09 PM  Was the AVS reviewed with patient?: Yes  How is the patient recovering?: Improving  Medications prescribed: Yes  Were they obtained?: Yes  Interventions: Provided patient education  Nurse Navigator toll free number, resources available and hours of operation provided to patient.

## 2023-10-31 ENCOUNTER — Ambulatory Visit: Payer: Medicare PPO | Attending: Family Medicine | Admitting: Family Medicine

## 2023-10-31 ENCOUNTER — Encounter (RURAL_HEALTH_CENTER): Payer: Self-pay | Admitting: Family Medicine

## 2023-10-31 ENCOUNTER — Other Ambulatory Visit: Payer: Self-pay

## 2023-10-31 ENCOUNTER — Ambulatory Visit (RURAL_HEALTH_CENTER): Payer: Medicare PPO | Attending: Family Medicine | Admitting: Family Medicine

## 2023-10-31 VITALS — BP 138/84 | HR 90 | Temp 99.3°F | Ht 66.0 in | Wt 155.0 lb

## 2023-10-31 DIAGNOSIS — Z853 Personal history of malignant neoplasm of breast: Secondary | ICD-10-CM | POA: Insufficient documentation

## 2023-10-31 DIAGNOSIS — I10 Essential (primary) hypertension: Secondary | ICD-10-CM | POA: Insufficient documentation

## 2023-10-31 DIAGNOSIS — R300 Dysuria: Secondary | ICD-10-CM | POA: Insufficient documentation

## 2023-10-31 DIAGNOSIS — E039 Hypothyroidism, unspecified: Secondary | ICD-10-CM | POA: Insufficient documentation

## 2023-10-31 DIAGNOSIS — L900 Lichen sclerosus et atrophicus: Secondary | ICD-10-CM | POA: Insufficient documentation

## 2023-10-31 DIAGNOSIS — E785 Hyperlipidemia, unspecified: Secondary | ICD-10-CM | POA: Insufficient documentation

## 2023-10-31 DIAGNOSIS — R739 Hyperglycemia, unspecified: Secondary | ICD-10-CM | POA: Insufficient documentation

## 2023-10-31 DIAGNOSIS — I129 Hypertensive chronic kidney disease with stage 1 through stage 4 chronic kidney disease, or unspecified chronic kidney disease: Secondary | ICD-10-CM | POA: Insufficient documentation

## 2023-10-31 DIAGNOSIS — E559 Vitamin D deficiency, unspecified: Secondary | ICD-10-CM | POA: Insufficient documentation

## 2023-10-31 LAB — URINALYSIS, MACROSCOPIC
BILIRUBIN: NEGATIVE mg/dL
BLOOD: NEGATIVE mg/dL
GLUCOSE: NEGATIVE mg/dL
KETONES: NEGATIVE mg/dL
LEUKOCYTES: 75 WBCs/uL — AB
NITRITE: NEGATIVE
PH: 7 (ref 5.0–9.0)
PROTEIN: NEGATIVE mg/dL
SPECIFIC GRAVITY: 1.009 (ref 1.002–1.030)
UROBILINOGEN: NORMAL mg/dL

## 2023-10-31 LAB — URINALYSIS, MICROSCOPIC
RBCS: 1 /[HPF] (ref <4)
SQUAMOUS EPITHELIAL: <1 /[HPF] (ref <28)
TRANSITIONAL EPITHELIAL CELLS URINE: 1 /[HPF] (ref <6)
WBCS: 12 /[HPF] — ABNORMAL HIGH (ref <6)

## 2023-10-31 MED ORDER — SENNOSIDES 8.6 MG-DOCUSATE SODIUM 50 MG TABLET
2.0000 | ORAL_TABLET | Freq: Every evening | ORAL | 3 refills | Status: DC
Start: 2023-10-31 — End: 2024-06-16

## 2023-10-31 MED ORDER — ATORVASTATIN 40 MG TABLET
40.0000 mg | ORAL_TABLET | Freq: Every evening | ORAL | 3 refills | Status: DC
Start: 2023-10-31 — End: 2023-12-17

## 2023-10-31 MED ORDER — CYCLOBENZAPRINE 5 MG TABLET
5.0000 mg | ORAL_TABLET | Freq: Three times a day (TID) | ORAL | 0 refills | Status: DC | PRN
Start: 2023-10-31 — End: 2023-12-17

## 2023-10-31 MED ORDER — ZOLPIDEM 10 MG TABLET
10.0000 mg | ORAL_TABLET | Freq: Every evening | ORAL | 1 refills | Status: DC | PRN
Start: 2023-10-31 — End: 2023-12-17

## 2023-10-31 MED ORDER — FAMOTIDINE 40 MG TABLET
40.0000 mg | ORAL_TABLET | Freq: Every evening | ORAL | 3 refills | Status: DC
Start: 2023-10-31 — End: 2023-12-17

## 2023-10-31 MED ORDER — AMLODIPINE 5 MG-VALSARTAN 160 MG TABLET
1.0000 | ORAL_TABLET | Freq: Every day | ORAL | 3 refills | Status: DC
Start: 2023-10-31 — End: 2023-12-17

## 2023-10-31 MED ORDER — SYNTHROID 25 MCG TABLET
25.0000 ug | ORAL_TABLET | Freq: Every morning | ORAL | 3 refills | Status: DC
Start: 2023-10-31 — End: 2023-12-17

## 2023-10-31 MED ORDER — OMEPRAZOLE 20 MG CAPSULE,DELAYED RELEASE
20.0000 mg | DELAYED_RELEASE_CAPSULE | Freq: Every day | ORAL | 3 refills | Status: DC
Start: 2023-10-31 — End: 2023-12-17

## 2023-10-31 NOTE — Progress Notes (Signed)
FAMILY MEDICINE, [facility] FAMILY MEDICINE [facility]  [address]  [place] [address]  Operated by [facility]     Name: Kari Gordon MRN:  [id number]   Date of Birth: [birth year] Age: 77 y.o.   Date: 10/31/2023  Time: 11:52     Provider: Weyman Pedro, DO    Assessment/Plan:  Problem List Items Addressed This Visit          Nephrology    Dysuria    Relevant Orders    URINALYSIS, MACROSCOPIC AND MICROSCOPIC W/CULTURE REFLEX       Endocrine    Hypothyroidism    Relevant Orders    THYROID STIMULATING HORMONE (SENSITIVE TSH)    Vitamin D deficiency    Relevant Orders    VITAMIN D 25 TOTAL       Dermatology    Lichen sclerosus et atrophicus     Other Visit Diagnoses       Hypertension, unspecified type    -  Primary    Relevant Orders    CBC/DIFF    COMPREHENSIVE METABOLIC PANEL, NON-FASTING    MAGNESIUM    Hyperglycemia        Relevant Orders    HGA1C (HEMOGLOBIN A1C WITH EST AVG GLUCOSE)    Hyperlipidemia, unspecified hyperlipidemia type        Relevant Orders    LIPID PANEL    HX: breast cancer        Relevant Orders    CA 27,29         Will get labs before next f/u  Will get urine today  Optimal blood pressure control is <130/<80.  Continue amlodipine/valsartan.    Carafate, Pepcid and prilosec help with GERD; to continue  Taking ajovy for migraine; to continue    Able to sleep with Ambien. Was not able to sleep without use of Ambien or with 1/2 dose. Continue 10 mg at night      Reason for visit: Follow Up (Follow up)      History of Present Illness:  Kari Gordon is a 77 y.o. female with GERD, hyperlipidemia, hypertension, osteoarthritis, sleep apnea, lichen sclerosis, and vitamin D deficiency who presents for a follow up.    No vaginal bleeding. Lichen sclerosis is doing better    Taking omega xL for her arthralgias which help. Has significant hand pain of the end of her fingers.    Face pain from trigeminal neuralgia is gone post procedure.    She had an ER visit  earlier this month. She was canning apples at the time and felt like she had lifted too much and caused costochondritis.  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    Abnormal cardiovascular stress test    UTI (urinary tract infection)    Alopecia    Insomnia    Herpes zoster    Dysuria    Type 2 diabetes, diet controlled (CMS HCC)    Hyperlipidemia LDL goal <70    CKD (chronic kidney disease), symptom management only, stage 2 (mild)  Vitamin D deficiency        Historical Data    Past Medical History:  Past Medical History:   Diagnosis Date    Alopecia 03/22/2022    Cancer (CMS HCC)     Diabetes mellitus, type 2 (CMS HCC)     Dysuria 03/22/2022    Esophageal reflux     Essential hypertension     Fibromyalgia     Herpes zoster 03/22/2022    Hx of breast cancer     Insomnia 03/22/2022    Nerve damage     Osteoarthritis     Sleep apnea     Type 2 diabetes, diet controlled (CMS HCC) 03/22/2022    Unspecified glaucoma(365.9)     Uterine cancer (CMS HCC)     UTI (urinary tract infection) 03/22/2022         Past Surgical History:  Past Surgical History:   Procedure Laterality Date    BILATERAL OOPHORECTOMY      COLONOSCOPY      dr Dewaine Conger 04/25/22 diverticular disease    EYE SURGERY      cornea and cataract surgery 12/29/21 left 05/04/22 right    HX BREAST LUMPECTOMY      HX CHOLECYSTECTOMY      HX HEART CATHETERIZATION      HX HYSTERECTOMY      LYMPH NODE BIOPSY      NERVE SURGERY      NOSE SURGERY      UTERINE FIBROID SURGERY           Allergies:  Allergies   Allergen Reactions    Hydrocodone-Acetaminophen Itching and Nausea/ Vomiting      headache  headache      Sulfa (Sulfonamides) Rash    Duloxetine Mental Status Effect    Erythromycin Nausea/ Vomiting     Medications:  Amlodipine-Valsartan 5-160 mg Oral Tablet, Take 1 Tablet by mouth Once a day  anastrozole (ARIMIDEX) 1 mg Oral Tablet, Take 1 Tablet (1 mg total) by mouth Once a day  aspirin (ECOTRIN) 81 mg Oral Tablet, Delayed Release (E.C.), Take 1 Tablet (81 mg total) by mouth  atorvastatin (LIPITOR) 40 mg Oral Tablet, Take 1 Tablet (40 mg total) by mouth Every evening  cholecalciferol, vitamin D3, 25 mcg (1,000 unit) Oral Tablet, Take 1 Tablet (1,000 Units total) by mouth Once a day  Clobetasol 0.05 % Gel, APPLY 1 APPLICATION TOPICALLY DAILY  Coenzyme Q10 (CO Q-10) 10 mg Oral Capsule, Take by mouth  cyclobenzaprine (FLEXERIL) 5 mg Oral Tablet, TAKE ONE TABLET BY MOUTH EVERY 8 HOURS AS NEEDED FOR MUSCLE SPASMS  famotidine (PEPCID) 40 mg Oral Tablet, Take 1 Tablet (40 mg total) by mouth Every evening  fremanezumab-vfrm (AJOVY AUTOINJECTOR) 225 mg/1.5 mL Subcutaneous Auto-Injector, Inject 1.5 mL (225 mg total) under the skin Every 30 days for 180 days  ketoconazole (NIZORAL) 2 % Cream, Apply topically  latanoprost (XALATAN) 0.005 % Ophthalmic Drops,   omega-3/dha/epa/fish oil (OMEGA-3 FISH OIL ORAL), Take 1 Tablet by mouth Once a day For joints  omega XL  omeprazole (PRILOSEC) 20 mg Oral Capsule, Delayed Release(E.C.), Take 1 Capsule (20 mg total) by mouth Once a day  prednisoLONE acetate (PRED FORTE) 1 % Ophthalmic Drops, Suspension,   Resorcinol-Petro-Cala-Zinc-Lan (RESINOL) 2-55 % Ointment, Apply topically Three times a day  sennosides-docusate sodium (SENOKOT-S) 8.6-50 mg Oral Tablet, Take 2 Tablets by mouth Every night  sucralfate (CARAFATE) 1 gram Oral Tablet, Take 1 Tablet (1 g total) by mouth Four times a day -  before meals and bedtime  SYNTHROID 25 mcg Oral Tablet, Take 1 Tablet (25 mcg total) by mouth Every morning  traMADoL (ULTRAM) 50 mg Oral Tablet, Take 1 Tablet (50 mg total) by  mouth Every 6 hours as needed for Pain (Patient not taking: Reported on 10/31/2023)  ubrogepant (UBRELVY) 100 mg Oral Tablet, Take 1 Tablet (100 mg total) by mouth Every 24 hours as needed (for migraine) for up to 90 days  zolpidem (AMBIEN) 10 mg Oral Tablet, Take 1 Tablet (10 mg total) by mouth Every night as needed for Insomnia    No facility-administered medications prior to visit.     Family History:  Family Medical History:       Problem Relation (Age of Onset)    Glaucoma Brother    Heart Disease Mother, Father    Hypertension (High Blood Pressure) Mother, Father    Stroke Mother, Father            Social History:  Social History     Socioeconomic History    Marital status: Married    Number of children: 2   Tobacco Use    Smoking status: Never    Smokeless tobacco: Never   Vaping Use    Vaping status: Never Used   Substance and Sexual Activity    Alcohol use: Never    Drug use: Never    Sexual activity: Not Currently     Partners: Male           Review of Systems:  Any pertinent Review of Systems as addressed in the HPI above.    Physical Exam:  Vital Signs:  Vitals:    10/31/23 1111   BP: 138/84   Pulse: 90   Temp: 37.4 C (99.3 F)   TempSrc: Tympanic   SpO2: 98%   Weight: 70.3 kg (155 lb)   Height: 1.676 m (5\' 6" )   BMI: 25.02     Physical Exam  Vitals reviewed.   Constitutional:       General: She is not in acute distress.     Appearance: Normal appearance.   HENT:      Head: Normocephalic and atraumatic.      Right Ear: Tympanic membrane normal. There is no impacted cerumen.      Left Ear: Tympanic membrane normal. There is no impacted cerumen.      Nose: No congestion or rhinorrhea.      Mouth/Throat:      Pharynx: No oropharyngeal exudate or posterior oropharyngeal erythema.   Eyes:      General:         Right eye: No discharge.         Left eye: No discharge.   Cardiovascular:      Rate and Rhythm: Normal rate and regular rhythm.      Pulses: Normal pulses.      Heart sounds: Normal heart sounds.    Pulmonary:      Effort: Pulmonary effort is normal.      Breath sounds: Normal breath sounds.   Abdominal:      Palpations: Abdomen is soft.      Tenderness: There is no abdominal tenderness.   Musculoskeletal:         General: Deformity present.      Right lower leg: No edema.      Left lower leg: No edema.      Comments: DIP joint bilateral hands with significant arthritis changes   Skin:  General: Skin is warm and dry.   Neurological:      General: No focal deficit present.      Mental Status: She is alert and oriented to person, place, and time.   Psychiatric:         Mood and Affect: Mood normal.         Behavior: Behavior normal.       Weyman Pedro, DO     Portions of this note may be dictated using voice recognition software or a dictation service. Variances in spelling and vocabulary are possible and unintentional. Not all errors are caught/corrected. Please notify the [facility] Parkin if any discrepancies are noted or if the meaning of any statement is not clear.

## 2023-11-02 LAB — URINE CULTURE,ROUTINE: URINE CULTURE: NO GROWTH — AB

## 2023-11-26 ENCOUNTER — Telehealth (RURAL_HEALTH_CENTER): Payer: Self-pay | Admitting: Family Medicine

## 2023-12-12 ENCOUNTER — Other Ambulatory Visit: Payer: Self-pay

## 2023-12-12 ENCOUNTER — Ambulatory Visit (RURAL_HEALTH_CENTER): Payer: Medicare PPO | Attending: Family Medicine

## 2023-12-12 ENCOUNTER — Ambulatory Visit: Payer: Medicare PPO | Attending: Family Medicine | Admitting: Family Medicine

## 2023-12-12 DIAGNOSIS — R739 Hyperglycemia, unspecified: Secondary | ICD-10-CM | POA: Insufficient documentation

## 2023-12-12 DIAGNOSIS — Z853 Personal history of malignant neoplasm of breast: Secondary | ICD-10-CM | POA: Insufficient documentation

## 2023-12-12 DIAGNOSIS — I10 Essential (primary) hypertension: Secondary | ICD-10-CM | POA: Insufficient documentation

## 2023-12-12 DIAGNOSIS — E039 Hypothyroidism, unspecified: Secondary | ICD-10-CM | POA: Insufficient documentation

## 2023-12-12 DIAGNOSIS — E559 Vitamin D deficiency, unspecified: Secondary | ICD-10-CM | POA: Insufficient documentation

## 2023-12-12 DIAGNOSIS — E785 Hyperlipidemia, unspecified: Secondary | ICD-10-CM | POA: Insufficient documentation

## 2023-12-12 LAB — CBC WITH DIFF
BASOPHIL #: 0 10*3/uL (ref 0.00–0.10)
BASOPHIL %: 0 % (ref 0–1)
EOSINOPHIL #: 0.1 10*3/uL (ref 0.00–0.50)
EOSINOPHIL %: 2 % (ref 1–7)
HCT: 38.3 % (ref 31.2–41.9)
HGB: 13.2 g/dL (ref 10.9–14.3)
LYMPHOCYTE #: 0.7 10*3/uL — ABNORMAL LOW (ref 1.00–3.00)
LYMPHOCYTE %: 10 % — ABNORMAL LOW (ref 16–44)
MCH: 32.3 pg (ref 24.7–32.8)
MCHC: 34.4 g/dL (ref 32.3–35.6)
MCV: 93.9 fL (ref 75.5–95.3)
MONOCYTE #: 0.7 10*3/uL (ref 0.30–1.00)
MONOCYTE %: 11 % (ref 5–13)
MPV: 7.8 fL — ABNORMAL LOW (ref 7.9–10.8)
NEUTROPHIL #: 5.2 10*3/uL (ref 1.85–7.80)
NEUTROPHIL %: 77 % (ref 43–77)
PLATELETS: 172 10*3/uL (ref 140–440)
RBC: 4.08 10*6/uL (ref 3.63–4.92)
RDW: 13.8 % (ref 12.3–17.7)
WBC: 6.7 10*3/uL (ref 3.8–11.8)

## 2023-12-12 LAB — COMPREHENSIVE METABOLIC PANEL, NON-FASTING
ALBUMIN/GLOBULIN RATIO: 1.4 (ref 0.8–1.4)
ALBUMIN: 4.2 g/dL (ref 3.5–5.7)
ALKALINE PHOSPHATASE: 62 U/L (ref 34–104)
ALT (SGPT): 19 U/L (ref 7–52)
ANION GAP: 4 mmol/L (ref 4–13)
AST (SGOT): 19 U/L (ref 13–39)
BILIRUBIN TOTAL: 0.6 mg/dL (ref 0.3–1.0)
BUN/CREA RATIO: 16 (ref 6–22)
BUN: 17 mg/dL (ref 7–25)
CALCIUM, CORRECTED: 9.6 mg/dL (ref 8.9–10.8)
CALCIUM: 9.8 mg/dL (ref 8.6–10.3)
CHLORIDE: 104 mmol/L (ref 98–107)
CO2 TOTAL: 28 mmol/L (ref 21–31)
CREATININE: 1.04 mg/dL (ref 0.60–1.30)
ESTIMATED GFR: 56 mL/min/{1.73_m2} — ABNORMAL LOW (ref >59)
GLOBULIN: 2.9 (ref 2.0–3.5)
GLUCOSE: 186 mg/dL — ABNORMAL HIGH (ref 74–109)
OSMOLALITY, CALCULATED: 278 mosm/kg (ref 270–290)
POTASSIUM: 4.4 mmol/L (ref 3.5–5.1)
PROTEIN TOTAL: 7.1 g/dL (ref 6.4–8.9)
SODIUM: 136 mmol/L (ref 136–145)

## 2023-12-12 LAB — VITAMIN D 25 TOTAL: VITAMIN D 25, TOTAL: 54.88 ng/mL (ref 30.00–100.00)

## 2023-12-12 LAB — LIPID PANEL
CHOL/HDL RATIO: 2.9
CHOLESTEROL: 137 mg/dL (ref <200)
HDL CHOL: 48 mg/dL (ref >=40)
LDL CALC: 62 mg/dL (ref 0–100)
TRIGLYCERIDES: 136 mg/dL (ref <=150)
VLDL CALC: 27 mg/dL (ref 0–50)

## 2023-12-12 LAB — MAGNESIUM: MAGNESIUM: 2.1 mg/dL (ref 1.9–2.7)

## 2023-12-12 LAB — THYROID STIMULATING HORMONE (SENSITIVE TSH): TSH: 2.354 u[IU]/mL (ref 0.450–5.330)

## 2023-12-12 LAB — HGA1C (HEMOGLOBIN A1C WITH EST AVG GLUCOSE): HEMOGLOBIN A1C: 7 % — ABNORMAL HIGH (ref 4.0–6.0)

## 2023-12-16 LAB — CA 27,29: CA 27.29: 13 U/mL (ref <38)

## 2023-12-17 ENCOUNTER — Encounter (RURAL_HEALTH_CENTER): Payer: Self-pay | Admitting: Family Medicine

## 2023-12-17 ENCOUNTER — Other Ambulatory Visit: Payer: Self-pay

## 2023-12-17 ENCOUNTER — Ambulatory Visit (RURAL_HEALTH_CENTER): Payer: Medicare PPO | Attending: Family Medicine | Admitting: Family Medicine

## 2023-12-17 ENCOUNTER — Other Ambulatory Visit (RURAL_HEALTH_CENTER): Payer: Self-pay | Admitting: Family Medicine

## 2023-12-17 VITALS — BP 130/80 | HR 96 | Temp 99.0°F | Ht 66.0 in | Wt 158.0 lb

## 2023-12-17 DIAGNOSIS — L900 Lichen sclerosus et atrophicus: Secondary | ICD-10-CM | POA: Insufficient documentation

## 2023-12-17 DIAGNOSIS — E1122 Type 2 diabetes mellitus with diabetic chronic kidney disease: Secondary | ICD-10-CM | POA: Insufficient documentation

## 2023-12-17 DIAGNOSIS — G4700 Insomnia, unspecified: Secondary | ICD-10-CM | POA: Insufficient documentation

## 2023-12-17 DIAGNOSIS — N183 Chronic kidney disease, stage 3 unspecified: Secondary | ICD-10-CM | POA: Insufficient documentation

## 2023-12-17 DIAGNOSIS — E039 Hypothyroidism, unspecified: Secondary | ICD-10-CM | POA: Insufficient documentation

## 2023-12-17 DIAGNOSIS — K219 Gastro-esophageal reflux disease without esophagitis: Secondary | ICD-10-CM | POA: Insufficient documentation

## 2023-12-17 DIAGNOSIS — E119 Type 2 diabetes mellitus without complications: Secondary | ICD-10-CM

## 2023-12-17 DIAGNOSIS — E785 Hyperlipidemia, unspecified: Secondary | ICD-10-CM | POA: Insufficient documentation

## 2023-12-17 DIAGNOSIS — H9190 Unspecified hearing loss, unspecified ear: Secondary | ICD-10-CM

## 2023-12-17 MED ORDER — ATORVASTATIN 40 MG TABLET
40.0000 mg | ORAL_TABLET | Freq: Every evening | ORAL | 3 refills | Status: DC
Start: 2023-12-17 — End: 2024-03-03

## 2023-12-17 MED ORDER — ZOLPIDEM 10 MG TABLET
10.0000 mg | ORAL_TABLET | Freq: Every evening | ORAL | 1 refills | Status: DC | PRN
Start: 2023-12-17 — End: 2024-06-16

## 2023-12-17 MED ORDER — OMEPRAZOLE 20 MG CAPSULE,DELAYED RELEASE: 20.0000 mg | DELAYED_RELEASE_CAPSULE | Freq: Every day | ORAL | 3 refills | Status: AC

## 2023-12-17 MED ORDER — CLOBETASOL 0.05 % TOPICAL GEL: Freq: Every day | CUTANEOUS | 11 refills | Status: DC

## 2023-12-17 MED ORDER — SUCRALFATE 1 GRAM TABLET
1.0000 g | ORAL_TABLET | Freq: Four times a day (QID) | ORAL | 11 refills | Status: DC
Start: 2023-12-17 — End: 2024-06-16

## 2023-12-17 MED ORDER — CYCLOBENZAPRINE 5 MG TABLET
5.0000 mg | ORAL_TABLET | Freq: Three times a day (TID) | ORAL | 3 refills | Status: DC | PRN
Start: 2023-12-17 — End: 2024-06-16

## 2023-12-17 MED ORDER — SYNTHROID 25 MCG TABLET
25.0000 ug | ORAL_TABLET | Freq: Every morning | ORAL | 3 refills | Status: DC
Start: 2023-12-17 — End: 2024-05-14

## 2023-12-17 MED ORDER — FAMOTIDINE 40 MG TABLET
40.0000 mg | ORAL_TABLET | Freq: Every evening | ORAL | 3 refills | Status: DC
Start: 2023-12-17 — End: 2023-12-17

## 2023-12-17 MED ORDER — AMLODIPINE 5 MG-VALSARTAN 160 MG TABLET
1.0000 | ORAL_TABLET | Freq: Every day | ORAL | 3 refills | Status: AC
Start: 2023-12-17 — End: 2024-12-16

## 2023-12-17 MED ORDER — FAMOTIDINE 40 MG TABLET
40.0000 mg | ORAL_TABLET | Freq: Two times a day (BID) | ORAL | 3 refills | Status: DC
Start: 2023-12-17 — End: 2024-06-16

## 2023-12-17 NOTE — Progress Notes (Signed)
FAMILY MEDICINE, [facility] FAMILY MEDICINE [facility]  [address]  [place] [address]  Operated by [facility]     Name: Kari Gordon MRN:  [id number]   Date of Birth: 10/29/1946 Age: 78 y.o.   Date: 12/17/2023  Time: 10:42     Provider: Weyman Pedro, DO    Assessment/Plan:  Problem List Items Addressed This Visit          Cardiovascular System    Hyperlipidemia LDL goal <70 - Primary       Neurologic    Insomnia       Nephrology    CKD (chronic kidney disease), symptom management only, stage 2 (mild)       Endocrine    Hypothyroidism    Type 2 diabetes, diet controlled (CMS [facility])       Dermatology    Lichen sclerosus et atrophicus     Other Visit Diagnoses       Gastroesophageal reflux disease, unspecified whether esophagitis present              Results for orders placed or performed in visit on 12/12/23 (from the past 672 hour(s))   COMPREHENSIVE METABOLIC PANEL, NON-FASTING    Collection Time: 12/12/23 10:03 AM   Result Value Ref Range    SODIUM 136 136 - 145 mmol/L    POTASSIUM 4.4 3.5 - 5.1 mmol/L    CHLORIDE 104 98 - 107 mmol/L    CO2 TOTAL 28 21 - 31 mmol/L    ANION GAP 4 4 - 13 mmol/L    BUN 17 7 - 25 mg/dL    CREATININE 2.13 0.86 - 1.30 mg/dL    BUN/CREA RATIO 16 6 - 22    ESTIMATED GFR 56 (L) >59 mL/min/1.18m^2    ALBUMIN 4.2 3.5 - 5.7 g/dL    CALCIUM 9.8 8.6 - 57.8 mg/dL    GLUCOSE  random glucose was elevated 186 (H) 74 - 109 mg/dL    ALKALINE PHOSPHATASE 62 34 - 104 U/L    ALT (SGPT) 19 7 - 52 U/L    AST (SGOT) 19 13 - 39 U/L    BILIRUBIN TOTAL 0.6 0.3 - 1.0 mg/dL    PROTEIN TOTAL 7.1 6.4 - 8.9 g/dL    ALBUMIN/GLOBULIN RATIO 1.4 0.8 - 1.4    OSMOLALITY, CALCULATED 278 270 - 290 mOsm/kg    CALCIUM, CORRECTED 9.6 8.9 - 10.8 mg/dL    GLOBULIN 2.9 2.0 - 3.5   HGA1C (HEMOGLOBIN A1C WITH EST AVG GLUCOSE)    Collection Time: 12/12/23 10:03 AM   Result Value Ref Range    HEMOGLOBIN A1C  fair control 7.0 (H) 4.0 - 6.0 %   VITAMIN D 25 TOTAL    Collection Time: 12/12/23  10:03 AM   Result Value Ref Range    VITAMIN D 25, TOTAL 54.88 30.00 - 100.00 ng/mL   THYROID STIMULATING HORMONE (SENSITIVE TSH)    Collection Time: 12/12/23 10:03 AM   Result Value Ref Range    TSH ok 2.354 0.450 - 5.330 uIU/mL   MAGNESIUM    Collection Time: 12/12/23 10:03 AM   Result Value Ref Range    MAGNESIUM 2.1 1.9 - 2.7 mg/dL   CA 46,96    Collection Time: 12/12/23 10:03 AM   Result Value Ref Range    CA 27.29 13 <38 U/mL   LIPID PANEL    Collection Time: 12/12/23 10:03 AM   Result Value Ref Range    CHOLESTEROL  137 <200 mg/dL    TRIGLYCERIDES 161 <=096 mg/dL    HDL CHOL 48 >=04 mg/dL    LDL CALC  cholesterol is ok 62 0 - 100 mg/dL    VLDL CALC 27 0 - 50 mg/dL    CHOL/HDL RATIO 2.9    CBC WITH DIFF    Collection Time: 12/12/23 10:03 AM   Result Value Ref Range    WBC 6.7 3.8 - 11.8 x10^3/uL    RBC 4.08 3.63 - 4.92 x10^6/uL    HGB 13.2 10.9 - 14.3 g/dL    HCT 54.0 98.1 - 19.1 %    MCV ok 93.9 75.5 - 95.3 fL    MCH 32.3 24.7 - 32.8 pg    MCHC 34.4 32.3 - 35.6 g/dL    RDW 47.8 29.5 - 62.1 %    PLATELETS 172 140 - 440 x10^3/uL    MPV 7.8 (L) 7.9 - 10.8 fL    NEUTROPHIL % 77 43 - 77 %    LYMPHOCYTE % 10 (L) 16 - 44 %    MONOCYTE % 11 5 - 13 %    EOSINOPHIL % 2 1 - 7 %    BASOPHIL % 0 0 - 1 %    NEUTROPHIL # 5.20 1.85 - 7.80 x10^3/uL    LYMPHOCYTE # 0.70 (L) 1.00 - 3.00 x10^3/uL    MONOCYTE # 0.70 0.30 - 1.00 x10^3/uL    EOSINOPHIL # 0.10 0.00 - 0.50 x10^3/uL    BASOPHIL # 0.00 0.00 - 0.10 x10^3/uL      BP well controlled on meds. Continue amlodipine/valsartan    Excellent control Lipitor. 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 Avoid NSAID    Increase Pepcid to bid. Will try to hold Prilosec to see if renal function improves. If not, will have to add meds    Reason for visit: Follow Up (routine)      History of Present Illness:  Kari Gordon is a 78 y.o. female with GERD, hyperlipidemia, hypertension, migraine headaches, osteoarthritis, sleep apnea, lichen  sclerosis, and vitamin D deficiency who presents for a follow up.      Has headaches on the left top of her head. More frequently. She has a follow up with neurology  She has nearly daily headACHES. Uses ajovy. She can tell when the next shot is due  She tried aimovig which did not help. She has talked with neurology about possibility of botox.      Face pain from trigeminal neuralgia is still gone post procedure.    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    Abnormal cardiovascular stress test    UTI (urinary tract infection)    Alopecia    Insomnia    Herpes zoster    Dysuria    Type 2 diabetes, diet controlled (CMS HCC)    Hyperlipidemia LDL goal <70    CKD (chronic kidney disease), symptom management only, stage 2 (mild)    Vitamin  D deficiency        Historical Data    Past Medical History:  Past Medical History:   Diagnosis Date    Alopecia 03/22/2022    Cancer (CMS HCC)     Diabetes mellitus, type 2 (CMS HCC)     Dysuria 03/22/2022    Esophageal reflux     Essential hypertension     Fibromyalgia     Herpes zoster 03/22/2022    Hx of breast cancer     Insomnia 03/22/2022    Nerve damage     Osteoarthritis     Sleep apnea     Type 2 diabetes, diet controlled (CMS HCC) 03/22/2022    Unspecified glaucoma(365.9)     Uterine cancer (CMS HCC)     UTI (urinary tract infection) 03/22/2022         Past Surgical History:  Past Surgical History:   Procedure Laterality Date    BILATERAL OOPHORECTOMY      COLONOSCOPY      dr Dewaine Conger 04/25/22 diverticular disease    EYE SURGERY      cornea and cataract surgery 12/29/21 left 05/04/22 right    HX  BREAST LUMPECTOMY      HX CHOLECYSTECTOMY      HX HEART CATHETERIZATION      HX HYSTERECTOMY      LYMPH NODE BIOPSY      NERVE SURGERY      NOSE SURGERY      UTERINE FIBROID SURGERY           Allergies:  Allergies   Allergen Reactions    Hydrocodone-Acetaminophen Itching and Nausea/ Vomiting     headache  headache      Sulfa (Sulfonamides) Rash    Duloxetine Mental Status Effect    Erythromycin Nausea/ Vomiting     Medications:  anastrozole (ARIMIDEX) 1 mg Oral Tablet, Take 1 Tablet (1 mg total) by mouth Once a day  aspirin (ECOTRIN) 81 mg Oral Tablet, Delayed Release (E.C.), Take 1 Tablet (81 mg total) by mouth  cholecalciferol, vitamin D3, 25 mcg (1,000 unit) Oral Tablet, Take 1 Tablet (1,000 Units total) by mouth Once a day  Coenzyme Q10 (CO Q-10) 10 mg Oral Capsule, Take by mouth  fremanezumab-vfrm (AJOVY AUTOINJECTOR) 225 mg/1.5 mL Subcutaneous Auto-Injector, Inject 1.5 mL (225 mg total) under the skin Every 30 days for 180 days  ketoconazole (NIZORAL) 2 % Cream, Apply topically  latanoprost (XALATAN) 0.005 % Ophthalmic Drops,   omega-3/dha/epa/fish oil (OMEGA-3 FISH OIL ORAL), Take 1 Tablet by mouth Once a day For joints  omega XL  prednisoLONE acetate (PRED FORTE) 1 % Ophthalmic Drops, Suspension,   Resorcinol-Petro-Cala-Zinc-Lan (RESINOL) 2-55 % Ointment, Apply topically Three times a day  sennosides-docusate sodium (SENOKOT-S) 8.6-50 mg Oral Tablet, Take 2 Tablets by mouth Every night  Amlodipine-Valsartan 5-160 mg Oral Tablet, Take 1 Tablet by mouth Once a day  atorvastatin (LIPITOR) 40 mg Oral Tablet, Take 1 Tablet (40 mg total) by mouth Every evening  Clobetasol 0.05 % Gel, APPLY 1 APPLICATION TOPICALLY DAILY  cyclobenzaprine (FLEXERIL) 5 mg Oral Tablet, Take 1 Tablet (5 mg total) by mouth Every 8 hours as needed for Muscle spasms  famotidine (PEPCID) 40 mg Oral Tablet, Take 1 Tablet (40 mg total) by mouth Every evening  omeprazole (PRILOSEC) 20 mg Oral Capsule, Delayed Release(E.C.), Take 1 Capsule (20  mg total) by mouth Once a day  sucralfate (CARAFATE) 1 gram Oral Tablet, Take 1 Tablet (1 g total) by mouth Four times  a day - before meals and bedtime  SYNTHROID 25 mcg Oral Tablet, Take 1 Tablet (25 mcg total) by mouth Every morning  zolpidem (AMBIEN) 10 mg Oral Tablet, Take 1 Tablet (10 mg total) by mouth Every night as needed for Insomnia    No facility-administered medications prior to visit.     Family History:  Family Medical History:       Problem Relation (Age of Onset)    Glaucoma Brother    Heart Disease Mother, Father    Hypertension (High Blood Pressure) Mother, Father    Stroke Mother, Father            Social History:  Social History     Socioeconomic History    Marital status: Married    Number of children: 2   Tobacco Use    Smoking status: Never    Smokeless tobacco: Never   Vaping Use    Vaping status: Never Used   Substance and Sexual Activity    Alcohol use: Never    Drug use: Never    Sexual activity: Not Currently     Partners: Male           Review of Systems:  Any pertinent Review of Systems as addressed in the HPI above.    Physical Exam:  Vital Signs:  Vitals:    12/17/23 0954   BP: 130/80   Pulse: 96   Temp: 37.2 C (99 F)   TempSrc: Tympanic   SpO2: 97%   Weight: 71.7 kg (158 lb)   Height: 1.676 m (5\' 6" )   BMI: 25.5     Physical Exam  Vitals reviewed.   Constitutional:       General: She is not in acute distress.     Appearance: Normal appearance.   HENT:      Head: Normocephalic and atraumatic.      Right Ear: Tympanic membrane normal. There is no impacted cerumen.      Left Ear: Tympanic membrane normal. There is no impacted cerumen.      Nose: No congestion or rhinorrhea.      Mouth/Throat:      Pharynx: No oropharyngeal exudate or posterior oropharyngeal erythema.   Eyes:      General:         Right eye: No discharge.         Left eye: No discharge.   Cardiovascular:      Rate and Rhythm: Normal rate and regular rhythm.      Pulses: Normal pulses.      Heart sounds: Normal heart  sounds.   Pulmonary:      Effort: Pulmonary effort is normal.      Breath sounds: Normal breath sounds.   Abdominal:      Palpations: Abdomen is soft.      Tenderness: There is no abdominal tenderness.   Musculoskeletal:         General: Deformity present.      Right lower leg: No edema.      Left lower leg: No edema.      Comments: DIP joint bilateral hands with significant arthritis changes   Skin:     General: Skin is warm and dry.   Neurological:      General: No focal deficit present.      Mental Status: She is alert and oriented to person, place, and time.   Psychiatric:         Mood and Affect: Mood normal.  Behavior: Behavior normal.       Weyman Pedro, DO     Portions of this note may be dictated using voice recognition software or a dictation service. Variances in spelling and vocabulary are possible and unintentional. Not all errors are caught/corrected. Please notify the [facility] Parkin if any discrepancies are noted or if the meaning of any statement is not clear.

## 2023-12-20 ENCOUNTER — Other Ambulatory Visit: Payer: Self-pay

## 2023-12-20 ENCOUNTER — Encounter (INDEPENDENT_AMBULATORY_CARE_PROVIDER_SITE_OTHER): Payer: Self-pay | Admitting: NEUROLOGY

## 2023-12-20 ENCOUNTER — Ambulatory Visit (INDEPENDENT_AMBULATORY_CARE_PROVIDER_SITE_OTHER): Payer: Medicare PPO | Admitting: NEUROLOGY

## 2023-12-20 VITALS — BP 136/76 | HR 110 | Temp 97.3°F | Wt 156.8 lb

## 2023-12-20 DIAGNOSIS — G4485 Primary stabbing headache: Secondary | ICD-10-CM

## 2023-12-20 DIAGNOSIS — G43701 Chronic migraine without aura, not intractable, with status migrainosus: Secondary | ICD-10-CM

## 2023-12-20 MED ORDER — UBRELVY 100 MG TABLET
1.0000 | ORAL_TABLET | ORAL | 5 refills | Status: DC | PRN
Start: 2023-12-20 — End: 2023-12-27

## 2023-12-20 NOTE — Progress Notes (Signed)
ASSESSMENT  MIGRAINE WITHOUT AURA:  She is a 78 year old woman who follows-up for longstanding history of headaches.  Headaches meet criteria for migraine.  They are unilateral, associated with photo and phonophobia, nausea and worsened with activity.  She currently has seen a significant reduction in frequency since being on Ajovy with Ubrelvy as abortive.  MRI of the brain is largely unremarkable.  Neurologic exam is nonfocal.  She has at this point failed multiple classes of medications including beta-blockers, ACE inhibitors, SNRI, antiepileptics and CGRP antagonists.  Ajovy has improved her frequency and severity and [name] is helpful if she can take it and rest.  She is, however, having a significant period of wearing off each month prior to when her Ajovy dose is due. Today we discussed possible trial of the increased dose of Ajovy that is dose every 3 months as a way to minimize wearing off period.   POSSIBLE PRIMARY STABBING HA:  In addition to above, she reports brief stabbing pains that recur throughout the day at times that seem to occur at any time of the month. Will trial melatonin.     PLAN  1. Trial Ajovy 675mg  every 3 months      Continue Ubrelvy p.r.n.      Continue Flexeril 5mg  PRN      Counseled on medication overuse headache      Encouraged aggressive hydration  2. Trial melatonin 3-5mg  QHS    RTC 4 months    Thank you for allowing me to participate in your patient's care and please do not hesitate to contact me for any questions or concerns.    Patrick North, DO  Assistant Professor of Neurology  [facility]     [phone]: I will continue to be the provider focal point in managing the chronic complex neurological condition  ==========================================================================================================================================    NAME:  Kari Gordon  DOB:  1946-09-09  VISIT DATE:  09/29/2022    CC:  Migraine    Patient  seen in consultation at the request of Dr. Weyman Pedro  History obtained from the patient and chart/records  Age of patient:  78 y.o.    INTERVAL: Since last visit, symptoms are relatively stable. She does notice wearing off of her Ajovy 1-2 weeks prior to next dose. [name] is still effective. She is having some stabbing pain that lasts a few seconds but can recur throughout the day which is bothersome to her.     HPI:   I had the pleasure of seeing your patient in neurology clinic for an outpatient consultation, who is a 78 y.o. year old female who was referred for evaluation of trigeminal neuralgia.  Please allow me to summarize the history for the record.    She states she has a longstanding history of migraine dating back to childhood.  She states that they are characterized by right neck pain that radiate into the temporal.  At times she can have headaches that begin between the eyes.  They are associated with photo and phonophobia as well as nausea.  They are worsened with activity.  She does have sensitivity to smells as well.  These headaches can last days.  She typically has more mild headaches on a daily basis and then we will have 2-3 incapacitating headaches per month that last multiple days.  She is taking Tylenol on a daily basis.  Currently she is on Ajovy and has been for the past 3 months and finds it  somewhat helpful in that she will get some headache-free days for a few days following a dose.  She is using [place] as an abortive treatment.  This has been quite helpful and she can often curtail the stronger headaches with this medication.  She is currently reserving the use of this for most severe headaches.  She has seen multiple    Previous medications:  Cymbalta  Irbesartan  Aimovig - initially effective, then lost efficacy  Topamax - ineffective  Nurtec - ineffective  Metoprolol - ineffective    Current medications:   Ashby Dawes    Of note she does have a history of right trigeminal  neuralgia.  This started in 2019 or 2020.  Angiography revealed contact with the nerve by nearby vessel.  She was referred to [facility] and underwent neurosurgical procedure to ameliorate her symptoms.  This was successful and she is not had symptoms since the procedure.  Had trigeminal neuralgia w neurologist to this point.  hich started in 2019 or 2020. Used medications which were not helpful. Then was referred to [facility] by Dr. Marta Antu and had surgery which was successful.     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    Abnormal cardiovascular stress test    UTI (urinary tract infection)    Alopecia    Insomnia    Herpes zoster    Dysuria    Type 2 diabetes, diet controlled (CMS HCC)    Hyperlipidemia LDL goal <70    CKD (chronic kidney disease), symptom management only, stage 2 (mild)    Vitamin D deficiency     Past Surgical History:   Procedure Laterality Date    BILATERAL OOPHORECTOMY      COLONOSCOPY      dr Dewaine Conger 04/25/22 diverticular disease    EYE SURGERY      cornea and cataract surgery 12/29/21 left 05/04/22 right    HX BREAST LUMPECTOMY      HX CHOLECYSTECTOMY      HX HEART CATHETERIZATION      HX HYSTERECTOMY      LYMPH NODE BIOPSY      NERVE SURGERY      NOSE SURGERY      UTERINE FIBROID SURGERY           Family Medical History:       Problem Relation (Age of Onset)    Glaucoma Brother    Heart Disease Mother, Father    Hypertension (High Blood Pressure)  Mother, Father    Stroke Mother, Father            Current Outpatient Medications   Medication Sig Dispense Refill    Amlodipine-Valsartan 5-160 mg Oral Tablet Take 1 Tablet by mouth Once a day 90 Tablet 3    anastrozole (ARIMIDEX) 1 mg Oral Tablet Take 1 Tablet (1 mg total) by mouth Once a day 90 Tablet 3    aspirin (ECOTRIN) 81 mg Oral Tablet, Delayed Release (E.C.) Take 1  Tablet (81 mg total) by mouth      atorvastatin (LIPITOR) 40 mg Oral Tablet Take 1 Tablet (40 mg total) by mouth Every evening 90 Tablet 3    cholecalciferol, vitamin D3, 25 mcg (1,000 unit) Oral Tablet Take 1 Tablet (1,000 Units total) by mouth Once a day      Clobetasol 0.05 % Gel Insert into the vagina Once a day 30 g 11    Coenzyme Q10 (CO Q-10) 10 mg Oral Capsule Take by mouth      cyclobenzaprine (FLEXERIL) 5 mg Oral Tablet Take 1 Tablet (5 mg total) by mouth Every 8 hours as needed for Muscle spasms 90 Tablet 3    famotidine (PEPCID) 40 mg Oral Tablet Take 1 Tablet (40 mg total) by mouth Twice daily 180 Tablet 3    fremanezumab-vfrm (AJOVY AUTOINJECTOR) 225 mg/1.5 mL Subcutaneous Auto-Injector Inject 1.5 mL (225 mg total) under the skin Every 30 days for 180 days 4.5 mL 1    ketoconazole (NIZORAL) 2 % Cream Apply topically      latanoprost (XALATAN) 0.005 % Ophthalmic Drops       omega-3/dha/epa/fish oil (OMEGA-3 FISH OIL ORAL) Take 1 Tablet by mouth Once a day For joints  omega XL      omeprazole (PRILOSEC) 20 mg Oral Capsule, Delayed Release(E.C.) Take 1 Capsule (20 mg total) by mouth Once a day 90 Capsule 3    prednisoLONE acetate (PRED FORTE) 1 % Ophthalmic Drops, Suspension       Resorcinol-Petro-Cala-Zinc-Lan (RESINOL) 2-55 % Ointment Apply topically Three times a day 50 g 3    sennosides-docusate sodium (SENOKOT-S) 8.6-50 mg Oral Tablet Take 2 Tablets by mouth Every night 180 Tablet 3    sucralfate (CARAFATE) 1 gram Oral Tablet Take 1 Tablet (1 g total) by mouth Four times a day - before meals and bedtime 180 Tablet 11    SYNTHROID  25 mcg Oral Tablet Take 1 Tablet (25 mcg total) by mouth Every morning 90 Tablet 3    ubrogepant (UBRELVY) 100 mg Oral Tablet Take 1 Tablet (100 mg total) by mouth Every 24 hours as needed (for migraine) for up to 180 days 9 Tablet 5    zolpidem (AMBIEN) 10 mg Oral Tablet Take 1 Tablet (10 mg total) by mouth Every night as needed for Insomnia 90 Tablet 1     No current facility-administered medications for this visit.     Allergies   Allergen Reactions    Hydrocodone-Acetaminophen Itching and Nausea/ Vomiting     headache  headache      Sulfa (Sulfonamides) Rash    Duloxetine Mental Status Effect    Erythromycin Nausea/ Vomiting     Social History     Socioeconomic History    Marital status: Married     Spouse name: Not on file    Number of children: 2    Years of education: Not on file    Highest education level: Not on file   Occupational History    Not on file   Tobacco Use    Smoking status: Never    Smokeless tobacco: Never   Vaping Use    Vaping status: Never Used   Substance and Sexual Activity    Alcohol use: Never    Drug use: Never    Sexual activity: Not Currently     Partners: Male   Other Topics Concern    Not on file   Social History Narrative    Not on file  Social Determinants of Psychologist, prison and probation services Strain: Not on file   Transportation Needs: Not on file   Social Connections: Not on file   Intimate Partner Violence: Not on file   Housing Stability: Not on file       ============================================================================================================================================  GENERAL EXAMINATION  BP 136/76 (Site: Right Arm, Patient Position: Sitting, Cuff Size: Adult)   Pulse (!) 110   Temp 36.3 C (97.3 F) (Temporal)   Wt 71.1 kg (156 lb 12.8 oz)   SpO2 98%   BMI 25.31 kg/m     Vital signs personally reviewed  General: No acute distress, alert  HEENT: Normocephalic, no scleral icterus  Extremities: No significant edema, No cyanosis    NEUROLOGIC  EXAM  On neurological exam, patient was awake, alert and answering questions appropriately  Speech was fluent, without dysarthria or aphasia.    CN  II-XII: grossly intact    MOTOR  Bulk: normal  Abnormal Movements: none    Strength:     MRC Grading Scale   Right Left   Deltoid 5 5   Biceps 5 5   Triceps 5 5   Wrist Extension - -   Wrist Flexion - -   Finger Extension - -   Finger Abduction - -   Finger Flexion - -   Hip Flexion 5 5   Hip Extension - -   Hip Abduction - -   Hip Adduction - -   Knee Extension 5 5   Knee Flexion 5 5   Ankle Dorsiflexion - -   Ankle Plantarflexion - -   Toe Extension - -   Toe Flexion - -     REFLEXES   Right Left   Biceps 3 3   Triceps 2 2   Brachioradialis 2 2   Patellar 2 (brisk) 2 (brisk)   Achilles 2 2   Plantar - -   Hoffman - -   Pectoralis - -   Jaw Jerk - -       SENSORY  Light touch:  Intact throughout    GAIT  General: casual, normal gait    ================================================================================================================================LABS  Personal Review of prior labs is notable for:   2024  TSH normal  Anti CCP negative   ANA negative  CMP largely within normal limits  CBC largely within normal  2023  TSH within normal limits   A1c 6.1   CMP largely within normal limits   CBC largely within normal limits  IMAGING  Personal Review of imaging is notable for:   MRI of the brain without contrast July 03, 2022  Scattered FLAIR hyperintensities consistent with chronic white matter disease, relatively well-preserved volume for age    OTHER DIAGNOSTICS  Personal Review of other prior diagnostics is notable for:  Not applicable

## 2023-12-21 ENCOUNTER — Telehealth (RURAL_HEALTH_CENTER): Payer: Self-pay | Admitting: Family Medicine

## 2023-12-21 NOTE — Telephone Encounter (Signed)
Patient called she states you took her off of omeprazole and put on famotidine 40 mg bid it just is not working for her. She has lots of indigestion and pain wants to go back on omeprazole since famotidine not helping. Please advise/br

## 2023-12-27 ENCOUNTER — Telehealth (INDEPENDENT_AMBULATORY_CARE_PROVIDER_SITE_OTHER): Payer: Self-pay | Admitting: NEUROLOGY

## 2023-12-27 MED ORDER — UBRELVY 100 MG TABLET
1.0000 | ORAL_TABLET | ORAL | 5 refills | Status: AC | PRN
Start: 2023-12-27 — End: 2024-07-08

## 2023-12-27 NOTE — Telephone Encounter (Signed)
Pt called stating that her rx for Kari Gordon needs to be sent to [facility] pharmacy. The mail in pharmacy you sent to previously doesn't dispense this medication

## 2024-02-08 ENCOUNTER — Other Ambulatory Visit (INDEPENDENT_AMBULATORY_CARE_PROVIDER_SITE_OTHER): Payer: Self-pay | Admitting: NEUROLOGY

## 2024-02-28 ENCOUNTER — Other Ambulatory Visit: Payer: Self-pay

## 2024-02-28 ENCOUNTER — Ambulatory Visit (RURAL_HEALTH_CENTER): Payer: Self-pay | Attending: Family Medicine | Admitting: Family Medicine

## 2024-02-28 ENCOUNTER — Ambulatory Visit: Attending: Family Medicine | Admitting: Family Medicine

## 2024-02-28 ENCOUNTER — Encounter (RURAL_HEALTH_CENTER): Payer: Self-pay | Admitting: Family Medicine

## 2024-02-28 VITALS — BP 122/70 | HR 88 | Temp 98.6°F | Ht 66.0 in | Wt 157.0 lb

## 2024-02-28 DIAGNOSIS — K219 Gastro-esophageal reflux disease without esophagitis: Secondary | ICD-10-CM | POA: Insufficient documentation

## 2024-02-28 DIAGNOSIS — N183 Chronic kidney disease, stage 3 unspecified: Secondary | ICD-10-CM | POA: Insufficient documentation

## 2024-02-28 DIAGNOSIS — E119 Type 2 diabetes mellitus without complications: Secondary | ICD-10-CM

## 2024-02-28 DIAGNOSIS — E1122 Type 2 diabetes mellitus with diabetic chronic kidney disease: Secondary | ICD-10-CM | POA: Insufficient documentation

## 2024-02-28 DIAGNOSIS — G43909 Migraine, unspecified, not intractable, without status migrainosus: Secondary | ICD-10-CM | POA: Insufficient documentation

## 2024-02-28 DIAGNOSIS — N390 Urinary tract infection, site not specified: Secondary | ICD-10-CM | POA: Insufficient documentation

## 2024-02-28 DIAGNOSIS — R300 Dysuria: Secondary | ICD-10-CM | POA: Insufficient documentation

## 2024-02-28 LAB — MICROALBUMIN/CREATININE RATIO, URINE, RANDOM
CREATININE RANDOM URINE: 20 mg/dL — ABNORMAL LOW (ref 30–125)
MICROALBUMIN RANDOM URINE: 1.3 mg/dL
MICROALBUMIN/CREATININE RATIO RANDOM URINE: 65 mg/g

## 2024-02-28 LAB — BASIC METABOLIC PANEL
ANION GAP: 11 mmol/L (ref 4–13)
BUN/CREA RATIO: 16 (ref 6–22)
BUN: 16 mg/dL (ref 7–25)
CALCIUM: 9.8 mg/dL (ref 8.6–10.3)
CHLORIDE: 101 mmol/L (ref 98–107)
CO2 TOTAL: 25 mmol/L (ref 21–31)
CREATININE: 0.99 mg/dL (ref 0.60–1.30)
ESTIMATED GFR: 59 mL/min/{1.73_m2} — ABNORMAL LOW (ref >59)
GLUCOSE: 179 mg/dL — ABNORMAL HIGH (ref 74–109)
OSMOLALITY, CALCULATED: 280 mosm/kg (ref 270–290)
POTASSIUM: 3.8 mmol/L (ref 3.5–5.1)
SODIUM: 137 mmol/L (ref 136–145)

## 2024-02-28 MED ORDER — SEMAGLUTIDE 0.25 MG OR 0.5 MG (2 MG/3 ML) SUBCUTANEOUS PEN INJECTOR
0.5000 mg | PEN_INJECTOR | SUBCUTANEOUS | 3 refills | Status: DC
Start: 2024-02-28 — End: 2024-02-28

## 2024-02-28 MED ORDER — SEMAGLUTIDE 0.25 MG OR 0.5 MG (2 MG/3 ML) SUBCUTANEOUS PEN INJECTOR
0.5000 mg | PEN_INJECTOR | SUBCUTANEOUS | 3 refills | Status: DC
Start: 2024-02-28 — End: 2024-07-08

## 2024-02-28 MED ORDER — CEFDINIR 300 MG CAPSULE
300.0000 mg | ORAL_CAPSULE | Freq: Two times a day (BID) | ORAL | 0 refills | Status: AC
Start: 2024-02-28 — End: 2024-03-09

## 2024-02-28 NOTE — Progress Notes (Signed)
 FAMILY MEDICINE, [facility]  [address]  [place] [address]  Operated by [facility]     Name: Kari Gordon MRN:  [id number]   Date of Birth: 27-May-1946 Age: 78 y.o.   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 I gave her a sample today and showed her how to use the medication    Repeat bmp.    Add omnicef  forUTI; send for culture    Was not able to completely stop Prilosec but using qod with Pepcid  with good control of GERD. To continue    For migraine, continue ajovy . F/U as scheduled with neurology          Reason for visit: Follow Up (Has questions about blood sugar has been over 200 but was at meeting ate a lot of carbs concerns about kidney function was to have next month also sinus pressure for several days no fever or other symptoms)      History of Present Illness:  Kari Gordon is a 78 y.o. female with GERD, hyperlipidemia, hypertension, migraine headaches, osteoarthritis, sleep apnea, lichen sclerosis, and vitamin D  deficiency who presents for a follow up.    Using ajovy  for headaches with improvement in sx. Has some right before her next injection is due    Has glucose elevation as high as 200 after eating with average glucose being 160. Wants to go back on medicine for glucose. Has taken ozempic  in the  past but was able to come off with weight loss. Willing to go back on it.    Feels like she has a sinus infection    Wants a repeat kidney function. Tried to stop Prilosec but had too much heartburn so she's taking qod.  Patient Active Problem List   Diagnosis    Long term (current) use of aromatase inhibitors    Fuchs' corneal dystrophy of both eyes    Chronic GERD    Hypothyroidism    Lichen sclerosus et atrophicus    Right trigeminal neuralgia    Age-related nuclear cataract of both eyes    Abnormal cardiovascular stress test    UTI (urinary tract infection)    Alopecia    Insomnia    Herpes zoster    Dysuria    Type 2 diabetes, diet controlled (CMS HCC)    Hyperlipidemia LDL goal <70    CKD (chronic kidney disease), symptom management only, stage 2 (mild)    Vitamin D  deficiency        Historical  Data    Past Medical History:  Past Medical History:   Diagnosis Date    Alopecia 03/22/2022    Cancer (CMS HCC)     Diabetes mellitus, type 2 (CMS HCC)     Dysuria 03/22/2022    Esophageal reflux     Essential hypertension     Fibromyalgia     Herpes zoster 03/22/2022    Hx of breast cancer     Insomnia 03/22/2022    Nerve damage     Osteoarthritis     Sleep apnea     Type 2 diabetes, diet controlled (CMS HCC) 03/22/2022    Unspecified glaucoma(365.9)     Uterine cancer (CMS HCC)     UTI (urinary tract infection) 03/22/2022         Past Surgical History:  Past Surgical History:   Procedure Laterality Date    BILATERAL OOPHORECTOMY      COLONOSCOPY      dr Delaine Favorite 04/25/22 diverticular disease    EYE SURGERY      cornea and cataract surgery 12/29/21 left 05/04/22 right    HX BREAST LUMPECTOMY      HX CHOLECYSTECTOMY      HX HEART CATHETERIZATION      HX HYSTERECTOMY      LYMPH NODE BIOPSY      NERVE SURGERY      NOSE SURGERY      UTERINE FIBROID SURGERY           Allergies:  Allergies   Allergen Reactions    Hydrocodone-Acetaminophen Itching and Nausea/ Vomiting     headache  headache      Sulfa (Sulfonamides) Rash     Duloxetine Mental Status Effect    Erythromycin Nausea/ Vomiting     Medications:  AJOVY  AUTOINJECTOR 225 mg/1.5 mL Subcutaneous Auto-Injector, INJECT 1.5 ML (225 MG) UNDER THE SKIN EVERY 30 DAYS  Amlodipine -Valsartan  5-160 mg Oral Tablet, Take 1 Tablet by mouth Once a day  anastrozole  (ARIMIDEX ) 1 mg Oral Tablet, Take 1 Tablet (1 mg total) by mouth Once a day  aspirin (ECOTRIN) 81 mg Oral Tablet, Delayed Release (E.C.), Take 1 Tablet (81 mg total) by mouth  atorvastatin  (LIPITOR) 40 mg Oral Tablet, Take 1 Tablet (40 mg total) by mouth Every evening  cholecalciferol, vitamin D3, 25 mcg (1,000 unit) Oral Tablet, Take 1 Tablet (1,000 Units total) by mouth Daily  Clobetasol  0.05 % Gel, Insert into the vagina Once a day  Coenzyme Q10 (CO Q-10) 10 mg Oral Capsule, Take by mouth Twice daily  cyclobenzaprine  (FLEXERIL ) 5 mg Oral Tablet, Take 1 Tablet (5 mg total) by mouth Every 8 hours as needed for Muscle spasms  famotidine  (PEPCID ) 40 mg Oral Tablet, Take 1 Tablet (40 mg total) by mouth Twice daily  ketoconazole (NIZORAL) 2 % Cream, Apply topically  latanoprost (XALATAN) 0.005 % Ophthalmic Drops,   omega-3/dha/epa/fish oil (OMEGA-3 FISH OIL ORAL), Take 1 Tablet by mouth Once a day For joints  omega XL  omeprazole  (PRILOSEC) 20 mg Oral Capsule, Delayed Release(E.C.), Take 1 Capsule (20 mg total) by mouth Once a day (Patient taking differently: Take 1 Capsule (20 mg total) by mouth Every other day)  prednisoLONE acetate (PRED FORTE) 1 % Ophthalmic Drops, Suspension,   Resorcinol-Petro-Cala-Zinc-Lan (RESINOL) 2-55 % Ointment, Apply topically Three times a day  sennosides-docusate sodium  (SENOKOT-S) 8.6-50 mg Oral Tablet, Take 2 Tablets by mouth Every night  sucralfate  (CARAFATE ) 1 gram Oral Tablet, Take 1 Tablet (1 g total) by mouth Four times  a day - before meals and bedtime  SYNTHROID  25 mcg Oral Tablet, Take 1 Tablet (25 mcg total) by mouth Every morning  ubrogepant  (UBRELVY ) 100 mg Oral Tablet, Take 1 Tablet (100 mg  total) by mouth Every 24 hours as needed (for migraine) for up to 180 days  zolpidem  (AMBIEN ) 10 mg Oral Tablet, Take 1 Tablet (10 mg total) by mouth Every night as needed for Insomnia    No facility-administered medications prior to visit.     Family History:  Family Medical History:       Problem Relation (Age of Onset)    Glaucoma Brother    Heart Disease Mother, Father    Hypertension (High Blood Pressure) Mother, Father    Stroke Mother, Father            Social History:  Social History     Socioeconomic History    Marital status: Married    Number of children: 2   Tobacco Use    Smoking status: Never    Smokeless tobacco: Never   Vaping Use    Vaping status: Never Used   Substance and Sexual Activity    Alcohol use: Never    Drug use: Never    Sexual activity: Not Currently     Partners: Male           Review of Systems:  Any pertinent Review of Systems as addressed in the HPI above.    Physical Exam:  Vital Signs:  Vitals:    02/28/24 0830   BP: 122/70   Pulse: 88   Temp: 37 C (98.6 F)   TempSrc: Tympanic   SpO2: 99%   Weight: 71.2 kg (157 lb)   Height: 1.676 m (5\' 6" )   BMI: 25.34     Physical Exam  Vitals reviewed.   Constitutional:       General: She is not in acute distress.     Appearance: Normal appearance.   HENT:      Head: Normocephalic and atraumatic.      Right Ear: Tympanic membrane normal. There is no impacted cerumen.      Left Ear: Tympanic membrane normal. There is no impacted cerumen.      Nose: No congestion or rhinorrhea.      Mouth/Throat:      Pharynx: No oropharyngeal exudate or posterior oropharyngeal erythema.   Eyes:      General:         Right eye: No discharge.         Left eye: No discharge.   Cardiovascular:      Rate and Rhythm: Normal rate and regular rhythm.      Pulses: Normal pulses.      Heart sounds: Normal heart sounds.   Pulmonary:      Effort: Pulmonary effort is normal.      Breath sounds: Normal breath sounds.   Abdominal:      Palpations: Abdomen is soft.       Tenderness: There is no abdominal tenderness.   Musculoskeletal:         General: Deformity present.      Right lower leg: No edema.      Left lower leg: No edema.      Comments: DIP joint bilateral hands with significant arthritis changes   Skin:     General: Skin is warm and dry.   Neurological:      General: No focal deficit present.      Mental  Status: She is alert and oriented to person, place, and time.   Psychiatric:         Mood and Affect: Mood normal.         Behavior: Behavior normal.       Reeda Canner, DO     Portions of this note may be dictated using voice recognition software or a dictation service. Variances in spelling and vocabulary are possible and unintentional. Not all errors are caught/corrected. Please notify the [facility] if any discrepancies are noted or if the meaning of any statement is not clear.

## 2024-02-29 ENCOUNTER — Ambulatory Visit (RURAL_HEALTH_CENTER): Payer: Self-pay | Admitting: Family Medicine

## 2024-03-01 LAB — URINE CULTURE: URINE CULTURE: 20000 — AB

## 2024-03-03 ENCOUNTER — Other Ambulatory Visit (RURAL_HEALTH_CENTER): Payer: Self-pay | Admitting: Family Medicine

## 2024-03-20 ENCOUNTER — Telehealth (RURAL_HEALTH_CENTER): Payer: Self-pay | Admitting: Family Medicine

## 2024-03-20 NOTE — Telephone Encounter (Signed)
 Patioent called she has been on ozempic 0.25 for 3 weeks still sick at stomach some better but has had constipation. Dr bowling said it was her choice she could stay at present dose do not go up to 0.5 stay at 0.25 if she wants to continue with meds and let her know or she can stop since her diabetes not that bad she will stay on medication for now but will call back and let us  know/br

## 2024-04-07 ENCOUNTER — Encounter (INDEPENDENT_AMBULATORY_CARE_PROVIDER_SITE_OTHER): Payer: Self-pay | Admitting: NEUROLOGY

## 2024-04-07 ENCOUNTER — Other Ambulatory Visit: Payer: Self-pay

## 2024-04-07 ENCOUNTER — Ambulatory Visit (INDEPENDENT_AMBULATORY_CARE_PROVIDER_SITE_OTHER): Payer: Self-pay | Admitting: NEUROLOGY

## 2024-04-07 VITALS — BP 134/65 | HR 109 | Temp 98.0°F | Wt 152.0 lb

## 2024-04-07 DIAGNOSIS — M79604 Pain in right leg: Secondary | ICD-10-CM

## 2024-04-07 DIAGNOSIS — M79605 Pain in left leg: Secondary | ICD-10-CM

## 2024-04-07 DIAGNOSIS — G43701 Chronic migraine without aura, not intractable, with status migrainosus: Secondary | ICD-10-CM

## 2024-04-07 NOTE — Progress Notes (Signed)
 ASSESSMENT  MIGRAINE WITHOUT AURA:  She is a 78 year old woman who follows-up for longstanding history of headaches.  Headaches meet criteria for migraine.  They are unilateral, associated with photo and phonophobia, nausea and worsened with activity.  She currently has seen a significant reduction in frequency since being on Ajovy  with Ubrelvy  as abortive.  MRI of the brain is largely unremarkable.  Neurologic exam is nonfocal.  She has at this point failed multiple classes of medications including beta-blockers, ACE inhibitors, SNRI, antiepileptics and CGRP antagonists.  Ajovy  has improved her frequency and severity and Ubrelvy  is helpful if she can take it and rest.  She is, however, having a significant period of wearing off each month prior to when her Ajovy  dose is due. Today we discussed possible trial of the increased dose of Ajovy  that is dosed every 3 months as a way to minimize wearing off period.   POSSIBLE PRIMARY STABBING HA:  In addition to above, she reports brief stabbing pains that recur throughout the day at times that seem to occur at any time of the month. These have subsided in recent months.   LOWER EXTREMITY PAIN: She reports many years of lower extremity pain that radiates down the side of the legs all the way to the ankle and foot at times. This sounds most concerning for radiculopathy though the character of the pain does not appear to be neuropathic in nature. She has failed physical therapy at this point. We will obtain EMG as well as MRI of the lumbar spine.     PLAN  1. Trial Ajovy  675mg  every 3 months      Continue Ubrelvy  p.r.n.      Continue Flexeril  5mg  PRN      Counseled on medication overuse headache      Encouraged aggressive hydration  2. Continue to monitor  3. Obtain MRI lumbar spine       Obtain EMG    RTC for electrodiagnostic testing.     Thank you for allowing me to participate in your patient's care and please do not hesitate to contact me for any questions or  concerns.    Veda Gerald, DO  Assistant Professor of Neurology  [place]  [facility]     [phone]: I will continue to be the provider focal point in managing the chronic complex neurological condition  ==========================================================================================================================================    NAME:  Kari Gordon  DOB:  06/19/1946  VISIT DATE:  09/29/2022    CC:  Migraine    Patient seen in consultation at the request of Dr. Reeda Canner  History obtained from the patient and chart/records  Age of patient:  78 y.o.    INTERVAL: Since last visit, she has been doing relatively well. Still has some wearing off, dose wasn't increased as she still had many refills of prior dose. Stabbing pain has largely resolved. She does wish to discuss significant lower extremity pain which has been ongoing for multiple decades. This is described as aching pain in the lateral aspect of the thighs that can extend down the lower leg. It happens bilaterally though right is worse. She does have history of FM.     HPI:   I had the pleasure of seeing your patient in neurology clinic for an outpatient consultation, who is a 78 y.o. year old female who was referred for evaluation of trigeminal neuralgia.  Please allow me to summarize the history for the record.    She states she  has a longstanding history of migraine dating back to childhood.  She states that they are characterized by right neck pain that radiate into the temporal.  At times she can have headaches that begin between the eyes.  They are associated with photo and phonophobia as well as nausea.  They are worsened with activity.  She does have sensitivity to smells as well.  These headaches can last days.  She typically has more mild headaches on a daily basis and then we will have 2-3 incapacitating headaches per month that last multiple days.  She is taking Tylenol on a daily basis.  Currently  she is on Ajovy  and has been for the past 3 months and finds it somewhat helpful in that she will get some headache-free days for a few days following a dose.  She is using Ubrelvy  as an abortive treatment.  This has been quite helpful and she can often curtail the stronger headaches with this medication.  She is currently reserving the use of this for most severe headaches.  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  This started in 2019 or 2020.  Angiography revealed contact with the nerve by nearby vessel.  She was referred to [facility] and underwent neurosurgical procedure to ameliorate her symptoms.  This was successful and she is not had symptoms since the procedure.  Had trigeminal neuralgia w neurologist to this point.  hich started in 2019 or 2020. Used medications which were not helpful. Then was referred to [facility] by Dr. Eliott Guess and had surgery which was successful.     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    Abnormal cardiovascular stress test    UTI (urinary tract infection)    Alopecia    Insomnia    Herpes zoster    Dysuria    Type 2 diabetes, diet controlled (CMS HCC)    Hyperlipidemia LDL goal <70    CKD (chronic kidney disease), symptom management only, stage 2 (mild)    Vitamin D  deficiency     Past Surgical History:   Procedure Laterality  Date    BILATERAL OOPHORECTOMY      COLONOSCOPY      dr Delaine Favorite 04/25/22 diverticular disease    EYE SURGERY      cornea and cataract surgery 12/29/21 left 05/04/22 right    HX BREAST LUMPECTOMY      HX CHOLECYSTECTOMY      HX HEART CATHETERIZATION      HX HYSTERECTOMY      LYMPH NODE BIOPSY      NERVE SURGERY      NOSE SURGERY      UTERINE FIBROID SURGERY           Family Medical History:       Problem Relation (Age of Onset)    Glaucoma Brother  Heart Disease Mother, Father    Hypertension (High Blood Pressure) Mother, Father    Stroke Mother, Father            Current Outpatient Medications   Medication Sig Dispense Refill    AJOVY  AUTOINJECTOR 225 mg/1.5 mL Subcutaneous Auto-Injector INJECT 1.5 ML (225 MG) UNDER THE SKIN EVERY 30 DAYS 4.5 mL 3    Amlodipine -Valsartan  5-160 mg Oral Tablet Take 1 Tablet by mouth Once a day 90 Tablet 3    anastrozole  (ARIMIDEX ) 1 mg Oral Tablet Take 1 Tablet (1 mg total) by mouth Once a day 90 Tablet 3    aspirin (ECOTRIN) 81 mg Oral Tablet, Delayed Release (E.C.) Take 1 Tablet (81 mg total) by mouth      atorvastatin  (LIPITOR) 40 mg Oral Tablet TAKE 1 TABLET EVERY EVENING 90 Tablet 3    cholecalciferol, vitamin D3, 25 mcg (1,000 unit) Oral Tablet Take 1 Tablet (1,000 Units total) by mouth Daily      Clobetasol  0.05 % Gel Insert into the vagina Once a day 30 g 11    Coenzyme Q10 (CO Q-10) 10 mg Oral Capsule Take by mouth Twice daily      cyclobenzaprine  (FLEXERIL ) 5 mg Oral Tablet Take 1 Tablet (5 mg total) by mouth Every 8 hours as needed for Muscle spasms 90 Tablet 3    famotidine  (PEPCID ) 40 mg Oral Tablet Take 1 Tablet (40 mg total) by mouth Twice daily 180 Tablet 3    ketoconazole (NIZORAL) 2 % Cream Apply topically      latanoprost (XALATAN) 0.005 % Ophthalmic Drops       omega-3/dha/epa/fish oil (OMEGA-3 FISH OIL ORAL) Take 1 Tablet by mouth Once a day For joints  omega XL      omeprazole  (PRILOSEC) 20 mg Oral Capsule, Delayed Release(E.C.) Take 1 Capsule (20 mg total) by mouth  Once a day (Patient taking differently: Take 1 Capsule (20 mg total) by mouth Every other day) 90 Capsule 3    prednisoLONE acetate (PRED FORTE) 1 % Ophthalmic Drops, Suspension       Resorcinol-Petro-[place]a-Zinc-Lan (RESINOL) 2-55 % Ointment Apply topically Three times a day 50 g 3    semaglutide  (OZEMPIC ) 0.25 mg or 0.5 mg (2 mg/3 mL) Subcutaneous Pen Injector Inject 0.5 mg under the skin Every 7 days 9 mL 3    sennosides-docusate sodium  (SENOKOT-S) 8.6-50 mg Oral Tablet Take 2 Tablets by mouth Every night 180 Tablet 3    sucralfate  (CARAFATE ) 1 gram Oral Tablet Take 1 Tablet (1 g total) by mouth Four times a day - before meals and bedtime 180 Tablet 11    SYNTHROID  25 mcg Oral Tablet Take 1 Tablet (25 mcg total) by mouth Every morning 90 Tablet 3    ubrogepant  (UBRELVY ) 100 mg Oral Tablet Take 1 Tablet (100 mg total) by mouth Every 24 hours as needed (for migraine) for up to 180 days 9 Tablet 5    zolpidem  (AMBIEN ) 10 mg Oral Tablet Take 1 Tablet (10 mg total) by mouth Every night as needed for Insomnia 90 Tablet 1     No current facility-administered medications for this visit.     Allergies   Allergen Reactions    Hydrocodone-Acetaminophen Itching and Nausea/ Vomiting     headache  headache      Sulfa (Sulfonamides) Rash    Duloxetine Mental Status Effect    Erythromycin Nausea/ Vomiting     Social History     Socioeconomic History    Marital  status: Married     Spouse name: Not on file    Number of children: 2    Years of education: Not on file    Highest education level: Not on file   Occupational History    Not on file   Tobacco Use    Smoking status: Never    Smokeless tobacco: Never   Vaping Use    Vaping status: Never Used   Substance and Sexual Activity    Alcohol use: Never    Drug use: Never    Sexual activity: Not Currently     Partners: Male   Other Topics Concern    Not on file   Social History Narrative    Not on file     Social Determinants of Health     Financial Resource Strain: Not on file    Transportation Needs: Not on file   Social Connections: Not on file   Intimate Partner Violence: Not on file   Housing Stability: Not on file       ============================================================================================================================================  GENERAL EXAMINATION  BP 134/65 (Site: Right Arm, Patient Position: Sitting)   Pulse (!) 109   Temp 36.7 C (98 F) (Temporal)   Wt 68.9 kg (152 lb)   SpO2 98%   BMI 24.53 kg/m     Vital signs personally reviewed  General: No acute distress, alert  HEENT: Normocephalic, no scleral icterus  Extremities: No significant edema, No cyanosis    NEUROLOGIC EXAM  On neurological exam, patient was awake, alert and answering questions appropriately  Speech was fluent, without dysarthria or aphasia.    CN  II-XII: grossly intact    MOTOR  Bulk: normal  Abnormal Movements: none    Strength:     MRC Grading Scale   Right Left   Deltoid 5 5   Biceps 5 5   Triceps 5 5   Wrist Extension - -   Wrist Flexion - -   Finger Extension - -   Finger Abduction - -   Finger Flexion - -   Hip Flexion 5 5   Hip Extension - -   Hip Abduction - -   Hip Adduction - -   Knee Extension 5 5   Knee Flexion 5 5   Ankle Dorsiflexion - -   Ankle Plantarflexion - -   Toe Extension - -   Toe Flexion - -     REFLEXES   Right Left   Biceps 3 3   Triceps 2 2   Brachioradialis 2 2   Patellar 2 (brisk) 2 (brisk)   Achilles 2 2   Plantar - -   Hoffman - -   Pectoralis - -   Jaw Jerk - -       SENSORY  Light touch:  Intact throughout    GAIT  General: casual, normal gait    ================================================================================================================================LABS  Personal Review of prior labs is notable for:   2024  TSH normal  Anti CCP negative   ANA negative  CMP largely within normal limits  CBC largely within normal  2023  TSH within normal limits   A1c 6.1   CMP largely within normal limits   CBC largely within normal  limits  IMAGING  Personal Review of imaging is notable for:   MRI of the brain without contrast July 03, 2022  Scattered FLAIR hyperintensities consistent with chronic white matter disease, relatively well-preserved volume for age    OTHER DIAGNOSTICS  Personal Review  of other prior diagnostics is notable for:  Not applicable

## 2024-04-18 ENCOUNTER — Telehealth (INDEPENDENT_AMBULATORY_CARE_PROVIDER_SITE_OTHER): Payer: Self-pay | Admitting: NEUROLOGY

## 2024-04-18 NOTE — Telephone Encounter (Signed)
 Patient is aware of when her appt for her MRI is

## 2024-05-09 ENCOUNTER — Encounter (RURAL_HEALTH_CENTER): Payer: Self-pay

## 2024-05-14 ENCOUNTER — Other Ambulatory Visit (RURAL_HEALTH_CENTER): Payer: Self-pay | Admitting: Family Medicine

## 2024-05-17 ENCOUNTER — Ambulatory Visit
Admission: RE | Admit: 2024-05-17 | Discharge: 2024-05-17 | Disposition: A | Discharge status: Home / Self Care | Payer: Self-pay | Source: Ambulatory Visit | Attending: NEUROLOGY | Admitting: NEUROLOGY

## 2024-05-17 ENCOUNTER — Other Ambulatory Visit: Payer: Self-pay

## 2024-05-17 DIAGNOSIS — M79604 Pain in right leg: Secondary | ICD-10-CM | POA: Insufficient documentation

## 2024-05-17 DIAGNOSIS — M79605 Pain in left leg: Secondary | ICD-10-CM | POA: Insufficient documentation

## 2024-05-19 DIAGNOSIS — M79605 Pain in left leg: Secondary | ICD-10-CM

## 2024-05-19 DIAGNOSIS — M79604 Pain in right leg: Secondary | ICD-10-CM

## 2024-05-26 ENCOUNTER — Other Ambulatory Visit (RURAL_HEALTH_CENTER): Payer: Self-pay | Admitting: Family Medicine

## 2024-06-02 ENCOUNTER — Ambulatory Visit (RURAL_HEALTH_CENTER): Attending: Family Medicine

## 2024-06-02 ENCOUNTER — Ambulatory Visit: Attending: Family Medicine | Admitting: Family Medicine

## 2024-06-02 ENCOUNTER — Other Ambulatory Visit: Payer: Self-pay

## 2024-06-02 DIAGNOSIS — E785 Hyperlipidemia, unspecified: Secondary | ICD-10-CM | POA: Insufficient documentation

## 2024-06-02 DIAGNOSIS — E119 Type 2 diabetes mellitus without complications: Secondary | ICD-10-CM | POA: Insufficient documentation

## 2024-06-02 DIAGNOSIS — K219 Gastro-esophageal reflux disease without esophagitis: Secondary | ICD-10-CM | POA: Insufficient documentation

## 2024-06-02 DIAGNOSIS — N182 Chronic kidney disease, stage 2 (mild): Secondary | ICD-10-CM | POA: Insufficient documentation

## 2024-06-02 LAB — LIPID PANEL
CHOL/HDL RATIO: 2.6
CHOLESTEROL: 124 mg/dL (ref <200)
HDL CHOL: 48 mg/dL (ref >=40)
LDL CALC: 49 mg/dL (ref 0–100)
TRIGLYCERIDES: 133 mg/dL (ref <=150)
VLDL CALC: 27 mg/dL (ref 0–50)

## 2024-06-02 LAB — COMPREHENSIVE METABOLIC PANEL, NON-FASTING
ALBUMIN/GLOBULIN RATIO: 1.7 — ABNORMAL HIGH (ref 0.8–1.4)
ALBUMIN: 4.4 g/dL (ref 3.5–5.7)
ALKALINE PHOSPHATASE: 52 U/L (ref 34–104)
ALT (SGPT): 15 U/L (ref 7–52)
ANION GAP: 7 mmol/L (ref 4–13)
AST (SGOT): 17 U/L (ref 13–39)
BILIRUBIN TOTAL: 0.7 mg/dL (ref 0.3–1.0)
BUN/CREA RATIO: 10 (ref 6–22)
BUN: 10 mg/dL (ref 7–25)
CALCIUM, CORRECTED: 9.7 mg/dL (ref 8.9–10.8)
CALCIUM: 10 mg/dL (ref 8.6–10.3)
CHLORIDE: 104 mmol/L (ref 98–107)
CO2 TOTAL: 27 mmol/L (ref 21–31)
CREATININE: 0.96 mg/dL (ref 0.60–1.30)
ESTIMATED GFR: 61 mL/min/1.73mˆ2 (ref >59)
GLOBULIN: 2.6 (ref 2.0–3.5)
GLUCOSE: 138 mg/dL — ABNORMAL HIGH (ref 74–109)
OSMOLALITY, CALCULATED: 277 mosm/kg (ref 270–290)
POTASSIUM: 4 mmol/L (ref 3.5–5.1)
PROTEIN TOTAL: 7 g/dL (ref 6.4–8.9)
SODIUM: 138 mmol/L (ref 136–145)

## 2024-06-02 LAB — CBC
HCT: 38.3 % (ref 31.2–41.9)
HGB: 13.2 g/dL (ref 10.9–14.3)
MCH: 32.9 pg — ABNORMAL HIGH (ref 24.7–32.8)
MCHC: 34.6 g/dL (ref 32.3–35.6)
MCV: 95.1 fL (ref 75.5–95.3)
MPV: 8.2 fL (ref 7.9–10.8)
PLATELETS: 180 x10ˆ3/uL (ref 140–440)
RBC: 4.03 x10ˆ6/uL (ref 3.63–4.92)
RDW: 13.7 % (ref 12.3–17.7)
WBC: 5.8 x10ˆ3/uL (ref 3.8–11.8)

## 2024-06-02 LAB — MICROALBUMIN/CREATININE RATIO, URINE, RANDOM
CREATININE RANDOM URINE: 19 mg/dL — ABNORMAL LOW (ref 30–125)
MICROALBUMIN RANDOM URINE: 1.6 mg/dL
MICROALBUMIN/CREATININE RATIO RANDOM URINE: 84.2 mg/g

## 2024-06-02 LAB — MAGNESIUM: MAGNESIUM: 2 mg/dL (ref 1.9–2.7)

## 2024-06-03 LAB — HGA1C (HEMOGLOBIN A1C WITH EST AVG GLUCOSE): HEMOGLOBIN A1C: 6.1 % — ABNORMAL HIGH (ref 4.0–6.0)

## 2024-06-07 ENCOUNTER — Ambulatory Visit (RURAL_HEALTH_CENTER): Payer: Self-pay | Admitting: Family Medicine

## 2024-06-11 ENCOUNTER — Ambulatory Visit (INDEPENDENT_AMBULATORY_CARE_PROVIDER_SITE_OTHER): Payer: Self-pay | Admitting: NEUROLOGY

## 2024-06-11 ENCOUNTER — Other Ambulatory Visit: Payer: Self-pay

## 2024-06-11 DIAGNOSIS — M79604 Pain in right leg: Secondary | ICD-10-CM

## 2024-06-11 DIAGNOSIS — M79605 Pain in left leg: Secondary | ICD-10-CM

## 2024-06-11 MED ORDER — AJOVY 225 MG/1.5 ML SUBCUTANEOUS AUTO-INJECTOR: 1.5000 mL | AUTO-INJECTOR | SUBCUTANEOUS | 0 refills | Status: DC

## 2024-06-11 NOTE — Procedures (Signed)
 NEUROLOGY, [facility]  [address]  [place] [address]    Procedure Note    Name: Kari Gordon MRN:  [id number]   Date: 06/11/2024 DOB:  [birth year] (78 y.o.)         [address]60 - NEEDLE ELECTROMYOGRAPHY; 1 EXTREMITY W/ OR W/O RELATED PARASPINAL AREAS (AMB ONLY)    Date/Time: 06/11/2024 10:18 AM    Performed by: Gordon Senior, DO  Authorized by: Gordon Senior, DO        ELECTROMYOGRAPHY REPORT    DATE OF SERVICE:  06/11/2024      PERFORMING PHYSICIAN:  Kari Gordon, D.O.      REFERRING PHYSICIAN:  CANDIE Arthea Gordon, D.O.    HISTORY:  She is a 78 year old woman who is referred for electrodiagnostic testing for leg pain. See most recent visit note from Apr 07, 2024 for detailed history.     EXAMINATION:  See most recent visit note from Apr 07, 2024 for detailed exam.     NERVE CONDUCTION STUDIES:  1.Normal right sural sensory nerve action potentials (SNAPs) for age.  2.Normal right fibular-EDB and tibial-AH compound muscle action potentials (CMAPs).      ELECTROMYOGRAPHY:  Normal studies for the right vastus-lateralis, tibialis anterior, and medial gastrocnemius.    IMPRESSION:  This is a normal study.  Specifically, there is no electrophysiologic evidence for a large fiber polyneuropathy at this time.     CLINICAL NOTE:   Above findings were discussed with the patient. As this testing was normal as was MRI of the lumbar spine, I do not have a clear explanation for her lower extremity discomfort. I ultimately think this is more of a musculoskeletal issue and may be related to fibromyalgia. Could give consideration to cervical spine imaging given brisk reflexes though I think cervical canal stenosis is less likely to be the culprit. We will continue to monitor.  She never started higher dose of Ajovy  but has been doing well on current dose so will continue.     Kari Darlyn Sherre, DO  Assistant Professor of Neurology  [place]  [facility] [place]

## 2024-06-16 ENCOUNTER — Ambulatory Visit (RURAL_HEALTH_CENTER): Payer: Self-pay | Attending: Family Medicine | Admitting: Family Medicine

## 2024-06-16 ENCOUNTER — Other Ambulatory Visit: Payer: Self-pay

## 2024-06-16 ENCOUNTER — Encounter (RURAL_HEALTH_CENTER): Payer: Self-pay | Admitting: Family Medicine

## 2024-06-16 VITALS — BP 136/76 | HR 94 | Temp 98.9°F | Ht 66.0 in | Wt 149.0 lb

## 2024-06-16 DIAGNOSIS — E1142 Type 2 diabetes mellitus with diabetic polyneuropathy: Secondary | ICD-10-CM | POA: Insufficient documentation

## 2024-06-16 DIAGNOSIS — M79641 Pain in right hand: Secondary | ICD-10-CM | POA: Insufficient documentation

## 2024-06-16 DIAGNOSIS — Z79890 Hormone replacement therapy (postmenopausal): Secondary | ICD-10-CM | POA: Insufficient documentation

## 2024-06-16 DIAGNOSIS — Z853 Personal history of malignant neoplasm of breast: Secondary | ICD-10-CM | POA: Insufficient documentation

## 2024-06-16 DIAGNOSIS — R809 Proteinuria, unspecified: Secondary | ICD-10-CM | POA: Insufficient documentation

## 2024-06-16 DIAGNOSIS — E1122 Type 2 diabetes mellitus with diabetic chronic kidney disease: Secondary | ICD-10-CM | POA: Insufficient documentation

## 2024-06-16 DIAGNOSIS — Z79899 Other long term (current) drug therapy: Secondary | ICD-10-CM | POA: Insufficient documentation

## 2024-06-16 DIAGNOSIS — I10 Essential (primary) hypertension: Secondary | ICD-10-CM | POA: Insufficient documentation

## 2024-06-16 DIAGNOSIS — M79642 Pain in left hand: Secondary | ICD-10-CM | POA: Insufficient documentation

## 2024-06-16 DIAGNOSIS — E785 Hyperlipidemia, unspecified: Secondary | ICD-10-CM | POA: Insufficient documentation

## 2024-06-16 DIAGNOSIS — M19041 Primary osteoarthritis, right hand: Secondary | ICD-10-CM | POA: Insufficient documentation

## 2024-06-16 DIAGNOSIS — G4700 Insomnia, unspecified: Secondary | ICD-10-CM | POA: Insufficient documentation

## 2024-06-16 DIAGNOSIS — G43909 Migraine, unspecified, not intractable, without status migrainosus: Secondary | ICD-10-CM | POA: Insufficient documentation

## 2024-06-16 DIAGNOSIS — M19042 Primary osteoarthritis, left hand: Secondary | ICD-10-CM | POA: Insufficient documentation

## 2024-06-16 DIAGNOSIS — L900 Lichen sclerosus et atrophicus: Secondary | ICD-10-CM | POA: Insufficient documentation

## 2024-06-16 DIAGNOSIS — N182 Chronic kidney disease, stage 2 (mild): Secondary | ICD-10-CM | POA: Insufficient documentation

## 2024-06-16 DIAGNOSIS — E559 Vitamin D deficiency, unspecified: Secondary | ICD-10-CM | POA: Insufficient documentation

## 2024-06-16 DIAGNOSIS — I129 Hypertensive chronic kidney disease with stage 1 through stage 4 chronic kidney disease, or unspecified chronic kidney disease: Secondary | ICD-10-CM | POA: Insufficient documentation

## 2024-06-16 DIAGNOSIS — K219 Gastro-esophageal reflux disease without esophagitis: Secondary | ICD-10-CM | POA: Insufficient documentation

## 2024-06-16 DIAGNOSIS — E119 Type 2 diabetes mellitus without complications: Secondary | ICD-10-CM | POA: Insufficient documentation

## 2024-06-16 MED ORDER — LEVOTHYROXINE 25 MCG TABLET: 25.0000 ug | ORAL_TABLET | Freq: Every morning | ORAL | 3 refills | Status: DC

## 2024-06-16 MED ORDER — SUCRALFATE 1 GRAM TABLET
1.0000 g | ORAL_TABLET | Freq: Four times a day (QID) | ORAL | 11 refills | Status: AC
Start: 2024-06-16 — End: ?

## 2024-06-16 MED ORDER — CYCLOBENZAPRINE 5 MG TABLET
5.0000 mg | ORAL_TABLET | Freq: Three times a day (TID) | ORAL | 3 refills | Status: AC | PRN
Start: 2024-06-16 — End: ?

## 2024-06-16 MED ORDER — AMLODIPINE 5 MG-VALSARTAN 320 MG TABLET: 1.0000 | ORAL_TABLET | Freq: Every day | ORAL | 3 refills | Status: DC

## 2024-06-16 MED ORDER — ZOLPIDEM 10 MG TABLET: 10.0000 mg | ORAL_TABLET | Freq: Every evening | ORAL | 1 refills | Status: DC | PRN

## 2024-06-16 MED ORDER — FLUCONAZOLE 150 MG TABLET
150.0000 mg | ORAL_TABLET | ORAL | 0 refills | Status: DC | PRN
Start: 2024-06-16 — End: 2024-07-08

## 2024-06-16 MED ORDER — FAMOTIDINE 40 MG TABLET: 40.0000 mg | ORAL_TABLET | Freq: Two times a day (BID) | ORAL | 3 refills | Status: DC

## 2024-06-16 MED ORDER — ATORVASTATIN 40 MG TABLET: 40.0000 mg | ORAL_TABLET | Freq: Every evening | ORAL | 3 refills | Status: DC

## 2024-06-16 MED ORDER — SENNOSIDES 8.6 MG-DOCUSATE SODIUM 50 MG TABLET
2.0000 | ORAL_TABLET | Freq: Every evening | ORAL | 3 refills | Status: AC
Start: 2024-06-16 — End: 2025-06-16

## 2024-06-16 NOTE — Progress Notes (Signed)
 FAMILY MEDICINE, [facility]  [address]  [place] [address]  Operated by [facility]  Progress Note    Name: Kari Gordon MRN:  [id number]   Date: 06/16/2024 DOB:  November 04, 1946 (78 y.o.)             Assessment & Plan    Assessment & Plan  Chronic Kidney Disease (CKD) Stage 3  CKD stage 3 with eGFR improvement from 56 in January to 57 recently putting her up to CKD stage 2. Microalbumin levels increased from 65 to 84, indicating proteinuria. Improved renal function may be attributed to Ozempic  use, but proteinuria remains a concern.  - Monitor blood pressure at home, aiming for systolic <130 mmHg.  - Increase valsartan  component of amlodipine -valsartan  to 5-320 mg.  - Avoid NSAIDs such as Motrin, Aleve, and ibuprofen.  - Consider adding Kerendia, Farxiga, or Jardiance if CKD worsens, noting potential for yeast infections.    Hypertension  Blood pressure is slightly elevated at 136/76 mmHg. Increasing valsartan  dosage is expected to improve blood pressure and renal function.  - Increase valsartan  component of amlodipine -valsartan  to 5-320 mg.  - Monitor blood pressure at home.    Type 2 Diabetes Mellitus  Diabetes is well-controlled with a recent A1c of 6.1, improved from 7.0 in January. Random glucose levels have also improved. Ozempic  was discontinued due to nausea, but glycemic control remains stable.  - Continue current diabetes management plan which is diet only    Gastroesophageal Reflux Disease (GERD)  GERD symptoms worsened with Ozempic  use. She is on Pepcid  40 mg twice daily and occasionally uses Prilosec. Severe acid reflux and possible hiatal hernia present. Prilosec may be needed regularly due to potential cell changes from severe reflux.  - Continue Pepcid  40 mg twice daily.  - Use Prilosec occasionally as needed, but consider regular use if EGD indicates cell changes.  - Refer to Dr. Sharron for an esophagogastroduodenoscopy (EGD) to assess for  Barrett's esophagus and hiatal hernia.    Osteoarthritis  Osteoarthritis with significant pain and swelling in the third digit of the right hand. Previous tests have ruled out rheumatoid arthritis.  - Order x-ray of hands to assess osteoarthritis progression.    Lichen Sclerosus et Atrophicus  Experiences episodes of lichen sclerosus, sometimes accompanied by yeast infections. Yeast infections are managed with Diflucan .  - Prescribe Diflucan  for yeast infections as needed.    Trigeminal Neuralgia  Post-surgical improvement noted, but recent sensitivity in the nasal area raises concern for recurrence.  - Monitor for any recurrence of symptoms.  Follows with Dr. Sherre and should keep appt for management of this and migraine ha which are much better    We reviewed recent lab together as noted below  Dr. Jolyne does diabetic eye exam    Follow-up  Follow-up plans discussed for various conditions.  - Schedule follow-up appointment with Dr. Sharron for EGD.  - Arrange for x-ray of hands.  - Ensure lab orders are in place for next visit.    Recording duration: 20 minutes           ICD-10-CM    1. Polyneuropathy due to type 2 diabetes mellitus (CMS HCC)  E11.42 VITAMIN B12      2. Gastroesophageal reflux disease, unspecified whether esophagitis present  K21.9 Referral to External Provider     MAGNESIUM     URIC ACID      3. CKD (chronic kidney disease), symptom management only, stage 2 (  mild)  N18.2 COMPREHENSIVE METABOLIC PANEL, NON-FASTING     VITAMIN D  25 TOTAL      4. Hyperlipidemia LDL goal <70  E78.5 LIPID PANEL      5. Type 2 diabetes, diet controlled (CMS HCC)  E11.9 HGA1C (HEMOGLOBIN A1C WITH EST AVG GLUCOSE)     MICROALBUMIN/CREATININE RATIO, URINE, RANDOM     THYROID  STIMULATING HORMONE (SENSITIVE TSH)      6. Migraine  G43.909       7. Insomnia  G47.00       8. Lichen sclerosus et atrophicus  L90.0       9. HX: breast cancer  Z85.3       10. Microalbuminuria  R80.9       11. Bilateral hand pain  M79.641 XR HANDS  BILATERAL    M79.642 CYCLIC CITRULLINATED PEPTIDE ANTIBODIES, IGG, SERUM     RHEUMATOID FACTOR, SERUM     HEP-2 SUBSTRATE ANTINUCLEAR ANTIBODIES (ANA), SERUM      12. Hypertension, unspecified type  I10 CBC/DIFF      13. Vitamin D  deficiency  E55.9 VITAMIN D  25 TOTAL           Orders Placed This Encounter    XR HANDS BILATERAL    CBC/DIFF    COMPREHENSIVE METABOLIC PANEL, NON-FASTING    HGA1C (HEMOGLOBIN A1C WITH EST AVG GLUCOSE)    LIPID PANEL    MICROALBUMIN/CREATININE RATIO, URINE, RANDOM    THYROID  STIMULATING HORMONE (SENSITIVE TSH)    MAGNESIUM    URIC ACID    VITAMIN B12    VITAMIN D  25 TOTAL    CYCLIC CITRULLINATED PEPTIDE ANTIBODIES, IGG, SERUM    RHEUMATOID FACTOR, SERUM    HEP-2 SUBSTRATE ANTINUCLEAR ANTIBODIES (ANA), SERUM    Referral to External Provider    zolpidem  (AMBIEN ) 10 mg Oral Tablet    sucralfate  (CARAFATE ) 1 gram Oral Tablet    sennosides-docusate sodium  (SENOKOT-S) 8.6-50 mg Oral Tablet    famotidine  (PEPCID ) 40 mg Oral Tablet    cyclobenzaprine  (FLEXERIL ) 5 mg Oral Tablet    atorvastatin  (LIPITOR) 40 mg Oral Tablet    levothyroxine  (SYNTHROID ) 25 mcg Oral Tablet    Amlodipine -Valsartan  (EXFORGE ) 5-320 mg Oral Tablet    fluconazole  (DIFLUCAN ) 150 mg Oral Tablet     Medications Discontinued During This Encounter   Medication Reason    semaglutide  (OZEMPIC ) 0.25 mg or 0.5 mg (2 mg/3 mL) Subcutaneous Pen Injector     Amlodipine -Valsartan  (EXFORGE ) 5-160 mg Oral Tablet     sennosides-docusate sodium  (SENOKOT-S) 8.6-50 mg Oral Tablet Reorder    sucralfate  (CARAFATE ) 1 gram Oral Tablet Reorder    cyclobenzaprine  (FLEXERIL ) 5 mg Oral Tablet Reorder    zolpidem  (AMBIEN ) 10 mg Oral Tablet Reorder    famotidine  (PEPCID ) 40 mg Oral Tablet Reorder    atorvastatin  (LIPITOR) 40 mg Oral Tablet Reorder    levothyroxine  (SYNTHROID ) 25 mcg Oral Tablet Reorder       Results for orders placed or performed in visit on 06/02/24 (from the past 4 weeks)   MAGNESIUM    Collection Time: 06/02/24  8:29 AM   Result  Value Ref Range    MAGNESIUM 2.0 1.9 - 2.7 mg/dL   CBC    Collection Time: 06/02/24  8:29 AM   Result Value Ref Range    WBC 5.8 3.8 - 11.8 x10^3/uL    RBC 4.03 3.63 - 4.92 x10^6/uL    HGB 13.2 10.9 - 14.3 g/dL    HCT 61.6 68.7 - 58.0 %  MCV 95.1 75.5 - 95.3 fL    MCH 32.9 (H) 24.7 - 32.8 pg    MCHC 34.6 32.3 - 35.6 g/dL    RDW 86.2 87.6 - 82.2 %    PLATELETS 180 140 - 440 x10^3/uL    MPV 8.2 7.9 - 10.8 fL   COMPREHENSIVE METABOLIC PANEL, NON-FASTING    Collection Time: 06/02/24  8:29 AM   Result Value Ref Range    SODIUM 138 136 - 145 mmol/L    POTASSIUM 4.0 3.5 - 5.1 mmol/L    CHLORIDE 104 98 - 107 mmol/L    CO2 TOTAL 27 21 - 31 mmol/L    ANION GAP 7 4 - 13 mmol/L    BUN 10 7 - 25 mg/dL    CREATININE 9.03 9.39 - 1.30 mg/dL    BUN/CREA RATIO 10 6 - 22    ESTIMATED GFR 61 >59 mL/min/1.63m^2    ALBUMIN 4.4 3.5 - 5.7 g/dL    CALCIUM 89.9 8.6 - 89.6 mg/dL    GLUCOSE 861 (H) 74 - 109 mg/dL    ALKALINE PHOSPHATASE 52 34 - 104 U/L    ALT (SGPT) 15 7 - 52 U/L    AST (SGOT) 17 13 - 39 U/L    BILIRUBIN TOTAL 0.7 0.3 - 1.0 mg/dL    PROTEIN TOTAL 7.0 6.4 - 8.9 g/dL    ALBUMIN/GLOBULIN RATIO 1.7 (H) 0.8 - 1.4    OSMOLALITY, CALCULATED 277 270 - 290 mOsm/kg    CALCIUM, CORRECTED 9.7 8.9 - 10.8 mg/dL    GLOBULIN 2.6 2.0 - 3.5   HGA1C (HEMOGLOBIN A1C WITH EST AVG GLUCOSE)    Collection Time: 06/02/24  8:29 AM   Result Value Ref Range    HEMOGLOBIN A1C 6.1 (H) 4.0 - 6.0 %   LIPID PANEL    Collection Time: 06/02/24  8:29 AM   Result Value Ref Range    CHOLESTEROL 124 <200 mg/dL    TRIGLYCERIDES 866 <=849 mg/dL    HDL CHOL 48 >=59 mg/dL    LDL CALC 49 0 - 899 mg/dL    VLDL CALC 27 0 - 50 mg/dL    CHOL/HDL RATIO 2.6    MICROALBUMIN/CREATININE RATIO, URINE, RANDOM    Collection Time: 06/02/24  8:29 AM   Result Value Ref Range    CREATININE RANDOM URINE 19 (L) 30 - 125 mg/dL    MICROALBUMIN RANDOM URINE 1.6 mg/dL    MICROALBUMIN/CREATININE RATIO RANDOM URINE 84.2 mg/g        Chief Complaint: Follow Up (routine)    Subjective:  Kari Gordon is a 78 y.o. female with GERD, hyperlipidemia, hypertension, migraine headaches, osteoarthritis, sleep apnea, lichen sclerosis, and vitamin D  deficiency who presents for a follow up.   History of Present Illness  Kari Gordon is a 78 year old female with stage 3 chronic kidney disease who presents for follow-up of her kidney function and medication management.    She discontinued Ozempic  after ten weeks due to nausea, though she did not experience vomiting. Her blood sugar levels have been fluctuating, with recent readings of 121 mg/dL and 831 mg/dL, which she finds concerning.    Her eGFR readings were 56 in January, 59 in March, and 61 most recently. She is currently taking Pepcid  40 mg twice daily after discontinuing Prilosec, which she occasionally uses for specific meals like spaghetti. She avoids NSAIDs such as Motrin and Aleve.    Her migraines have improved, and she is on a  lower dose of her migraine medication, which she manages through mail-order prescriptions. She reports better sleep with Ambien  and has been more active, engaging in deep cleaning her house with assistance.    She experiences a dry, hacky cough, which she attributes to drainage from allergies. She occasionally takes Benadryl for allergies and has noted that heartburn may contribute to the cough.    She has a history of lichen sclerosus and sometimes experiences symptoms similar to a yeast infection, for which she takes medication as needed.    She reports arthritis in her right hand, particularly affecting the third digit, which is swollen and painful, impacting her daily activities. She has been tested for rheumatoid arthritis multiple times, with negative results.    She has a history of trigeminal neuralgia, which improved after surgery, but she recently noticed increased sensitivity in her nose area, raising concerns about recurrence.       Objective:  BP 136/76 (Site: Left Arm, Patient Position: Sitting, Cuff Size: Adult)    Pulse 94   Temp 37.2 C (98.9 F) (Tympanic)   Ht 1.676 m (5' 6)   Wt 67.6 kg (149 lb)   SpO2 98%   BMI 24.05 kg/m       Physical Exam   Physical Exam  VITALS: BP- 136/76  GENERAL: Alert, cooperative, well developed, no acute distress.  HEENT: Normocephalic, normal oropharynx, moist mucous membranes.  CHEST: Clear to auscultation bilaterally, no wheezes, rhonchi, or crackles.  CARDIOVASCULAR: Normal heart rate and rhythm, S1 and S2 normal without murmurs.  ABDOMEN: Soft, non-tender, non-distended, without organomegaly, normal bowel sounds.  EXTREMITIES: No cyanosis or edema.  MUSCULOSKELETAL: Right hand swollen.  NEUROLOGICAL: Cranial nerves grossly intact, moves all extremities without gross motor or sensory deficit.     Results  LABS    Blood Glucose Monitor: 121, 168    HbA1c: 6.1%    eGFR: 61 mL/min/1.73 m    Microalbumin: 84 mg/L    Random Glucose: 138 mg/dL    LDL: 49 mmol/L     Data reviewed:      Current Outpatient Medications   Medication Sig    Amlodipine -Valsartan  (EXFORGE ) 5-320 mg Oral Tablet Take 1 Tablet by mouth Daily Indications: high blood pressure    anastrozole  (ARIMIDEX ) 1 mg Oral Tablet Take 1 Tablet (1 mg total) by mouth Once a day    aspirin (ECOTRIN) 81 mg Oral Tablet, Delayed Release (E.C.) Take 1 Tablet (81 mg total) by mouth    atorvastatin  (LIPITOR) 40 mg Oral Tablet Take 1 Tablet (40 mg total) by mouth Every evening    cholecalciferol, vitamin D3, 25 mcg (1,000 unit) Oral Tablet Take 1 Tablet (1,000 Units total) by mouth Daily    Clobetasol  0.05 % Gel Insert into the vagina Once a day    Coenzyme Q10 (CO Q-10) 10 mg Oral Capsule Take by mouth Twice daily    cyclobenzaprine  (FLEXERIL ) 5 mg Oral Tablet Take 1 Tablet (5 mg total) by mouth Every 8 hours as needed for Muscle spasms    famotidine  (PEPCID ) 40 mg Oral Tablet Take 1 Tablet (40 mg total) by mouth Twice daily    fluconazole  (DIFLUCAN ) 150 mg Oral Tablet Take 1 Tablet (150 mg total) by mouth Every 72 hours as needed     fremanezumab -vfrm (AJOVY  AUTOINJECTOR) 225 mg/1.5 mL Subcutaneous Auto-Injector Inject 1.5 mL (225 mg total) under the skin Every 30 days for 180 days    ketoconazole (NIZORAL) 2 % Cream Apply topically  latanoprost (XALATAN) 0.005 % Ophthalmic Drops     levothyroxine  (SYNTHROID ) 25 mcg Oral Tablet Take 1 Tablet (25 mcg total) by mouth Every morning    omega-3/dha/epa/fish oil (OMEGA-3 FISH OIL ORAL) Take 1 Tablet by mouth Once a day For joints  omega XL (Patient taking differently: Take 2 Tablets by mouth Twice daily For joints  omega XL)    omeprazole  (PRILOSEC) 20 mg Oral Capsule, Delayed Release(E.C.) Take 1 Capsule (20 mg total) by mouth Once a day    prednisoLONE acetate (PRED FORTE) 1 % Ophthalmic Drops, Suspension     Resorcinol-Petro-Cala-Zinc-Lan (RESINOL) 2-55 % Ointment Apply topically Three times a day    sennosides-docusate sodium  (SENOKOT-S) 8.6-50 mg Oral Tablet Take 2 Tablets by mouth Every night    sucralfate  (CARAFATE ) 1 gram Oral Tablet Take 1 Tablet (1 g total) by mouth Four times a day - before meals and bedtime    ubrogepant  (UBRELVY ) 100 mg Oral Tablet Take 1 Tablet (100 mg total) by mouth Every 24 hours as needed (for migraine) for up to 180 days    zolpidem  (AMBIEN ) 10 mg Oral Tablet Take 1 Tablet (10 mg total) by mouth Every night as needed for Insomnia       Jhonny Romano, DO     This note was created with assistance from [employer] via capture of conversational audio. Consent was obtained from the patient and all parties present prior to recording.

## 2024-06-26 ENCOUNTER — Other Ambulatory Visit (RURAL_HEALTH_CENTER): Payer: Self-pay | Admitting: Family Medicine

## 2024-06-27 ENCOUNTER — Other Ambulatory Visit (RURAL_HEALTH_CENTER): Payer: Self-pay | Admitting: Family Medicine

## 2024-07-08 ENCOUNTER — Encounter (RURAL_HEALTH_CENTER): Payer: Self-pay | Admitting: Family Medicine

## 2024-07-08 ENCOUNTER — Other Ambulatory Visit: Payer: Self-pay

## 2024-07-08 ENCOUNTER — Ambulatory Visit (RURAL_HEALTH_CENTER): Payer: Self-pay | Attending: Family Medicine | Admitting: Family Medicine

## 2024-07-08 VITALS — BP 136/80 | HR 92 | Temp 99.4°F | Ht 65.0 in | Wt 149.0 lb

## 2024-07-08 DIAGNOSIS — S91311A Laceration without foreign body, right foot, initial encounter: Secondary | ICD-10-CM | POA: Insufficient documentation

## 2024-07-08 DIAGNOSIS — Z0000 Encounter for general adult medical examination without abnormal findings: Secondary | ICD-10-CM | POA: Insufficient documentation

## 2024-07-08 DIAGNOSIS — Z0001 Encounter for general adult medical examination with abnormal findings: Secondary | ICD-10-CM | POA: Insufficient documentation

## 2024-07-08 DIAGNOSIS — S99921A Unspecified injury of right foot, initial encounter: Secondary | ICD-10-CM | POA: Insufficient documentation

## 2024-07-08 DIAGNOSIS — Z7984 Long term (current) use of oral hypoglycemic drugs: Secondary | ICD-10-CM | POA: Insufficient documentation

## 2024-07-08 DIAGNOSIS — Z23 Encounter for immunization: Secondary | ICD-10-CM | POA: Insufficient documentation

## 2024-07-08 DIAGNOSIS — Z780 Asymptomatic menopausal state: Secondary | ICD-10-CM | POA: Insufficient documentation

## 2024-07-08 DIAGNOSIS — E119 Type 2 diabetes mellitus without complications: Secondary | ICD-10-CM | POA: Insufficient documentation

## 2024-07-08 DIAGNOSIS — S91319A Laceration without foreign body, unspecified foot, initial encounter: Secondary | ICD-10-CM | POA: Insufficient documentation

## 2024-07-08 DIAGNOSIS — S99929A Unspecified injury of unspecified foot, initial encounter: Secondary | ICD-10-CM | POA: Insufficient documentation

## 2024-07-08 DIAGNOSIS — M8580 Other specified disorders of bone density and structure, unspecified site: Secondary | ICD-10-CM | POA: Insufficient documentation

## 2024-07-08 MED ORDER — EMPAGLIFLOZIN 10 MG TABLET: 10.0000 mg | ORAL_TABLET | Freq: Every day | ORAL | 4 refills | Status: DC

## 2024-07-08 NOTE — Patient Instructions (Signed)
 Medicare Preventive Services  Medicare coverage information Recommendation for YOU   Heart Disease and Diabetes   Lipid profile Every 5 years or more often if at risk for cardiovascular disease     Lab Results   Component Value Date    CHOLESTEROL 124 06/02/2024    HDLCHOL 48 06/02/2024    LDLCHOL 49 06/02/2024    TRIG 133 06/02/2024         Diabetes Screening    Yearly for those at risk for diabetes, 2 tests per year for those with prediabetes Last Glucose: 138    Diabetes Self Management Training or Medical Nutrition Therapy  For those with diabetes, up to 10 hrs initial training within a year, subsequent years up to 2 hrs of follow up training Optional for those with diabetes     Medical Nutrition Therapy  Three hours of one-on-one counseling in first year, two hours in subsequent years Optional for those with diabetes, kidney disease   Intensive Behavioral Therapy for Obesity  Face-to-face counseling, first month every week, month 2-6 every other week, month 7-12 every month if continued progress is documented Optional for those with Body Mass Index 30 or higher  Your Body mass index is 24.79 kg/m.   Tobacco Cessation (Quitting) Counseling   Covers up to 8 smoking and tobacco-use cessation counseling sessions in a 82-month period.    Optional for those that use tobacco   Cancer Screening Last Completion Date   Colorectal screening   For anyone age 33 to 4 or any age if high risk:  Screening Colonoscopy every 10 yrs if low risk,  more frequent if higher risk  OR  Cologuard Stool DNA test once every 3 years OR  Fecal Occult Blood Testing yearly OR  Flexible  Sigmoidoscopy  every 5 yr OR  CT Colonography every 5 yrs      See below for due date if applicable.   Screening Pap Test   Recommended every 3 years for all women age 32 to 59, or every five years if combined with HPV test (routine screening not needed after total hysterectomy).  Medicare covers every 2 years or yearly if high risk.  Screening Pelvic Exam    Medicare covers every 2 years, yearly if high risk or childbearing age with abnormal Pap in last 3 yrs.     See below for due date if applicable.   Screening Mammogram   Recommended every 2 years for women age 59 to 40, or more frequent if you have a higher risk. Selectively recommended for women between 40-49 based on shared decisions about risk. Covered by Medicare up to every year for women age 53 or older   See below for due date if applicable.         Lung Cancer Screening  Annual low dose computed tomography (LDCT scan) is recommended for those age 57-80 who smoked 20 pack-years and are current smokers or quit smoking within past 15 years, after counseling by your doctor or nurse clinician about the possible benefits or harms.     See below for due date if applicable.   Vaccinations   Respiratory syncytial virus (RSV)  Age 66 years or older: Based on shared clinical decision-making with your provider.  Pneumococcal Vaccine  Recommended routinely age 53+ with one or two separate vaccines based on your risk. Recommended before age 70 if medical conditions with increased risk  Seasonal Influenza Vaccine  Once every flu season   Hepatitis B Vaccine  3 doses if risk (including anyone with diabetes or liver disease)  Shingles Vaccine  Two doses at age 84 or older  Diphtheria Tetanus Pertussis Vaccine  ONCE as adult, booster every 10 years     Immunization History   Administered Date(s) Administered   . AFLURIA-Influenza Vaccine (3 Yr+)(Admin) 08/31/2008, 09/04/2012   . AREXVY (ADULT RSV) 09/20/2022   . Covid-19 Vaccine,Moderna Bivalent 08/31/2021   . Covid-19 Vaccine,Moderna,12 Years+ 12/25/2019, 12/26/2019, 01/30/2020   . Covid-19 Vaccine,Pfizer(Comirnaty),40mcg/0.3ml,12 yr+,Seasonal 08/22/2023   . High-Dose Influenza Vaccine, 65+ 08/17/2013, 08/24/2017, 07/28/2020, 08/22/2021, 09/14/2022   . PREVNAR 20 09/11/2023   . ZOSTAVAX (VARICELLA ZOSTER VACCINE) 09/11/2012     Shingles vaccine and Diphtheria Tetanus  Pertussis vaccines are available at pharmacies or local health department without a prescription.   Other Preventative Screening  Last Completion Date   Bone Densitometry   Screening: All females ages 71 and older every 10 years if initial screening normal. Postmenopausal women ages 54-64 need screening with one or more risk factor: previous fracture, parental hip fracture, current smoker, low body weight, excessive alcohol use, Rheumatoid Arthritis   For women with diagnosed Osteoporosis, follow up is recommended every 2 years or a frequency recommended by your provider.     --07/02/2023  See below for due date if applicable.     Glaucoma Screening   Yearly if in high risk group such as diabetes, family history, African American age 22+ or Hispanic American age 90+   See your eye care provider for screening.   Hepatitis C Screening   Recommended  for those born between ages 18-79 years.     See below for due date if applicable.     HIV Testing  Recommended routinely at least ONCE, covered every year for age 53 to 28 regardless of risk, and every year for age over 25 who ask for the test or higher risk. Yearly or up to 3 times in pregnancy         See below for due date if applicable.   Abdominal Aortic Aneurysm Screening Ultrasound   Once with a family history of abdominal aortic aneurysms OR a female between65-75 and have smoked at least 100 cigarettes in your lifetime.         See below for due date if applicable.       Your Personalized Schedule for Preventive Tests   Health Maintenance: Pending and Last Completed       Date Due Completion Date    Adult Tdap-Td (1 - Tdap) Never done ---    Medicare Annual Wellness Visit - Calendar Year Insurers Never done ---    Shingles Vaccine (2 of 3) 07/08/2025 (Originally 11/06/2012) 09/11/2012    Influenza Vaccine (1) 08/04/2024 09/14/2022    Diabetic A1C 12/02/2024 06/02/2024    Diabetic Kidney Health eGFR 06/02/2025 06/02/2024    Diabetic Kidney Health Microalb/Cr Ratio  06/02/2025 06/02/2024    Diabetic Retinal Exam 06/18/2025 06/18/2024    Osteoporosis screening 07/01/2025 07/02/2023    Depression Screening 07/08/2025 07/08/2024                For Information on Advanced Directives for Health Care:  [place]:  [email or web address]  [place], [place], [place], [place] General Information: [email or web address]

## 2024-07-08 NOTE — Progress Notes (Signed)
 FAMILY MEDICINE, [facility] FAMILY MEDICINE [facility]  [address]  [place] [address]  Operated by [facility]  Medicare Annual Wellness Visit    Name: Kari Gordon MRN:  [id number]   Date: 07/08/2024 Age: 78 y.o.       SUBJECTIVE:   Kari Gordon is a 78 y.o. female for presenting for Medicare Wellness exam.   I have reviewed and reconciled the medication list with the patient today.    She has had her shingrix. Not sure if she had the health dept in [place] or at the pharmacy    Never had a tetanus since the 50's. She notes that everyone tells her she needs one but nobody gives her one. Discussed that her insurance does not cover in the office unless she has a cut. Notes that she did cut her foot after she dropped a crock pot on it. Now, has laceration to her foot. Minimal redness, minimal discharge per pt.    She is worried about her glucose control since she did not tolerate ozempic     Did find her last bone density. Has t score -2. Does not tolerate fosamax/actonel per the pt. Does not want to take meds        07/08/2024     9:42 AM   Comprehensive Health Assessment-Adult   Do you wish to complete this form? Yes   During the past 4 weeks, how would you rate your health in general? Fair   During the past 4 weeks, how much difficulty have you had doing your usual activities inside and outside your home because of medical or emotional problems? A little bit of difficulty   During the past 4 weeks, was someone available to help you if you needed and wanted help? Yes, as much as I wanted   In the past year, how many times have you gone to the emergency department or been admitted to a hospital for a health problem? None   Are you generally satisfied with your sleep? No       4 hrs per night   Do you have enough money to buy things you need in everyday life, such as food, clothing, medicines, and housing? Yes, always   Can you get to places beyond walking distance without  help?  (For example, can you drive your own car or travel alone on buses)? Yes   Do you fasten your seatbelt when you are in a car? Yes, usually   Do you exercise 20 minutes 3 or more days per week (such as walking, dancing, biking, mowing grass, swimming)? No, I usually don't exercise this much   How often do you eat food that is healthy (fruits, vegetables, lean meats) instead of unhealthy (sweets, fast food, junk food, fatty foods)? Some of the time   How often do you have trouble taking medicines the eay you are told to take them? I always take them as prescribed   Do you need any help communicating with your doctors and nurses because of vision or hearing problems? No   During the past 12 months, have you experienced confusion or memory loss that is happening more often or is getting worse? No   Do you have one person you think of as your personal doctor (primary care provider or family doctor)? Yes   Are you now also seeing any specialist physician(s) (such as eye doctor, foot doctor, skin doctor)? Yes   If you are seeing a  specialist for anything such as foot, eye, skin, etc.  please list their name(s) dr cox neurology,dr blaydes and dr shana in [place] [facility]   How confident are you that you can control or manage most of your health problems? Somewhat confident       I have reviewed and updated as appropriate the past medical, family and social history. 07/08/2024 as summarized below:  Past Medical History:   Diagnosis Date    Alopecia 03/22/2022    Cancer (CMS HCC)     Diabetes mellitus, type 2     Dysuria 03/22/2022    Esophageal reflux     Essential hypertension     Fibromyalgia     Herpes zoster 03/22/2022    Hx of breast cancer     Insomnia 03/22/2022    Nerve damage     Osteoarthritis     Sleep apnea     Type 2 diabetes, diet controlled (CMS HCC) 03/22/2022    Unspecified glaucoma(365.9)     Uterine cancer     UTI (urinary tract infection) 03/22/2022     Past Surgical History:   Procedure Laterality  Date    Bilateral oophorectomy      Colonoscopy      Eye surgery      Hx breast lumpectomy      Hx cholecystectomy      Hx heart catheterization      Hx hysterectomy      Lymph node biopsy      Nerve surgery      Nose surgery      Uterine fibroid surgery       Current Outpatient Medications   Medication Sig    Amlodipine -Valsartan  (EXFORGE ) 5-320 mg Oral Tablet Take 1 Tablet by mouth Daily Indications: high blood pressure    anastrozole  (ARIMIDEX ) 1 mg Oral Tablet Take 1 Tablet (1 mg total) by mouth Once a day    aspirin (ECOTRIN) 81 mg Oral Tablet, Delayed Release (E.C.) Take 1 Tablet (81 mg total) by mouth    atorvastatin  (LIPITOR) 40 mg Oral Tablet Take 1 Tablet (40 mg total) by mouth Every evening    cholecalciferol, vitamin D3, 25 mcg (1,000 unit) Oral Tablet Take 1 Tablet (1,000 Units total) by mouth Daily    Clobetasol  0.05 % Gel Insert into the vagina Once a day    Coenzyme Q10 (CO Q-10) 10 mg Oral Capsule Take by mouth Twice daily    cyclobenzaprine  (FLEXERIL ) 5 mg Oral Tablet Take 1 Tablet (5 mg total) by mouth Every 8 hours as needed for Muscle spasms    empagliflozin  (JARDIANCE ) 10 mg Oral Tablet Take 1 Tablet (10 mg total) by mouth Daily    famotidine  (PEPCID ) 40 mg Oral Tablet Take 1 Tablet (40 mg total) by mouth Twice daily    fremanezumab -vfrm (AJOVY  AUTOINJECTOR) 225 mg/1.5 mL Subcutaneous Auto-Injector Inject 1.5 mL (225 mg total) under the skin Every 30 days for 180 days    ketoconazole (NIZORAL) 2 % Cream Apply topically    latanoprost (XALATAN) 0.005 % Ophthalmic Drops     levothyroxine  (SYNTHROID ) 25 mcg Oral Tablet Take 1 Tablet (25 mcg total) by mouth Every morning    omega-3/dha/epa/fish oil (OMEGA-3 FISH OIL ORAL) Take 1 Tablet by mouth Once a day For joints  omega XL (Patient taking differently: Take 2 Tablets by mouth Twice daily For joints  omega XL)    omeprazole  (PRILOSEC) 20 mg Oral Capsule, Delayed Release(E.C.) Take 1 Capsule (20 mg total) by mouth  Once a day    prednisoLONE acetate  (PRED FORTE) 1 % Ophthalmic Drops, Suspension     Resorcinol-Petro-[place]a-Zinc-Lan (RESINOL) 2-55 % Ointment Apply topically Three times a day    sennosides-docusate sodium  (SENOKOT-S) 8.6-50 mg Oral Tablet Take 2 Tablets by mouth Every night    sucralfate  (CARAFATE ) 1 gram Oral Tablet Take 1 Tablet (1 g total) by mouth Four times a day - before meals and bedtime    ubrogepant  (UBRELVY ) 100 mg Oral Tablet Take 1 Tablet (100 mg total) by mouth Every 24 hours as needed (for migraine) for up to 180 days    zolpidem  (AMBIEN ) 10 mg Oral Tablet Take 1 Tablet (10 mg total) by mouth Every night as needed for Insomnia     Family Medical History:       Problem Relation (Age of Onset)    Glaucoma Brother    Heart Disease Mother, Father    Hypertension (High Blood Pressure) Mother, Father    Stroke Mother, Father            Social History     Socioeconomic History    Marital status: Married    Number of children: 2   Tobacco Use    Smoking status: Never    Smokeless tobacco: Never   Vaping Use    Vaping status: Never Used   Substance and Sexual Activity    Alcohol use: Never    Drug use: Never    Sexual activity: Not Currently     Partners: Male     Social Determinants of Health     Health Literacy: Low Risk  (07/08/2024)    Health Literacy     SDOH Health Literacy: Never         List of Current Health Care Providers   Care Team       PCP       Name Type Specialty Phone Number    Chesley Million, DO Physician [facility] [phone]              Care Team       No care team found                      Health Maintenance   Topic Date Due    Adult Tdap-Td (1 - Tdap) Never done    Medicare Annual Wellness Visit - Calendar Year Insurers  Never done    Shingles Vaccine (2 of 3) 07/08/2025 (Originally 11/06/2012)    Influenza Vaccine (1) 08/04/2024    Diabetic A1C  12/02/2024    Diabetic Kidney Health eGFR  06/02/2025    Diabetic Kidney Health Microalb/Cr Ratio  06/02/2025    Diabetic Retinal Exam  06/18/2025    Osteoporosis  screening  07/01/2025    Depression Screening  07/08/2025    RSV Adult 60+ or Pregnancy  Completed    Pneumococcal Vaccination, Age 82+  Completed    Hepatitis C screening  Discontinued    Covid-19 Vaccine  Discontinued     Medicare Wellness Assessment   Medicare initial or wellness physical in the last year?: Yes  Advance Directives   Does patient have a living will or MPOA: Yes   Has patient provided [employer] with a copy?: Yes              Activities of Daily Living   Do you need help with dressing, bathing, or walking?: No   Do you need help with shopping, housekeeping, medications, or  finances?: No   Do you have rugs in hallways, broken steps, or poor lighting?: No   Do you have grab bars in your bathroom, non-slip strips in your tub, and hand rails on your stairs?: No   Cognitive Function Screen (1=Yes, 0=No)   What is you age?: Correct   What is the time to the nearest hour?: Correct   What is the year?: Correct   What is the name of this clinic?: Correct   Can the patient recognize two persons (the doctor, the nurse, home help, etc.)?: Correct   What is the date of your birth? (day and month sufficient) : Correct   In what year did World War II end?: Correct   Who is the current president of the [place] ?: Correct   Count from 20 down to 1?: Correct   What address did I give you earlier?: Incorrect   Total Score: 9       Fall Risk Screen   Do you feel unsteady when standing or walking?: No  Do you worry about falling?: No  Have you fallen in the past year?: No   Depression Screen     Little interest or pleasure in doing things.: Not at all  Feeling down, depressed, or hopeless: Not at all  PHQ 2 Total: 0     Pain Score   Pain Score:   5    Substance Use-Abuse Screening     Tobacco Use     In Past 12 MONTHS, how often have you used any tobacco product (for example, cigarettes, e-cigarettes, cigars, pipes, or smokeless tobacco)?: Never     Alcohol use     In the PAST 12 MONTHS, how often have you had  5 (men)/4 (women) or more drinks containing alcohol in one day?: Never     Prescription Drug Use     In the PAST 12 months, how often have you used any prescription medications just for the feeling, more than prescribed, or that were not prescribed for you? Prescriptions may include: opioids, benzodiazepines, medications for ADHD: Never           Illicit Drug Use   In the PAST 12 MONTHS, how often have you used any drugs, including marijuana, cocaine or crack, heroin, methamphetamine, hallucinogens, ecstasy/MDMA?: Never        Hearing Screen   Have you noticed any hearing difficulties?: Yes (hearing aids)       Vision Screen   Right Eye = 20: 0.4 (with correction)   Left Eye = 20: 0.5 (with correction)     Urine Incontinence Screen   Urinary Incontinence Screen  Do you ever leak urine when you don't want to?: No        OBJECTIVE:   BP 136/80 (Patient Position: Sitting, Cuff Size: Adult)   Pulse 92   Temp 37.4 C (99.4 F) (Tympanic)   Ht 1.651 m (5' 5)   Wt 67.6 kg (149 lb)   SpO2 98%   BMI 24.79 kg/m        Other appropriate exam:  Laceration right foot medially. Mild calor, mild erythema. No discharge    Health Maintenance Due   Topic Date Due    Adult Tdap-Td (1 - Tdap) Never done    Medicare Annual Wellness Visit - Calendar Year Insurers  Never done      ASSESSMENT & PLAN:   Assessment/Plan   1. Medicare annual wellness visit, subsequent    2. Osteopenia, unspecified location  3. Post-menopausal    4. Foot injury    5. Need for diphtheria-tetanus-pertussis (Tdap) vaccine    6. Laceration of foot    7. Type 2 diabetes, diet controlled (CMS HCC)       Identified Risk Factors/ Recommended Actions       The PHQ 2 Total: 0 depression screen is interpreted as negative.      Good overall diabetes control but did not tolerate ozempic   Will add jardiance  10 mg instead to take once pill daily. Stop if develop any sores and let me know instantly    Does not tolerate medicines for osteopenia. Continue with weight  bearing exercises. Calcium 600 mg po bid.    Will give Tdap today. Had injury with crock pot to her right foot.      Not due for bone density until July 2026.  Has not tolerate fosamax  Orders Placed This Encounter    CANCELED: DEXA BONE DENSITOMETRY    ADACEL--TDAP 77YRS & OLDER (ADMIN)    empagliflozin  (JARDIANCE ) 10 mg Oral Tablet        The patient has been educated about risk factors and recommended preventive care. Written Prevention Plan completed/ updated and given to patient (see After Visit Summary).    Medicare Preventive Services  Medicare coverage information Recommendation for YOU   Heart Disease and Diabetes   Lipid profile Every 5 years or more often if at risk for cardiovascular disease     Lab Results   Component Value Date    CHOLESTEROL 124 06/02/2024    HDLCHOL 48 06/02/2024    LDLCHOL 49 06/02/2024    TRIG 133 06/02/2024         Diabetes Screening    Yearly for those at risk for diabetes, 2 tests per year for those with prediabetes Last Glucose: 138    Diabetes Self Management Training or Medical Nutrition Therapy  For those with diabetes, up to 10 hrs initial training within a year, subsequent years up to 2 hrs of follow up training Optional for those with diabetes     Medical Nutrition Therapy  Three hours of one-on-one counseling in first year, two hours in subsequent years Optional for those with diabetes, kidney disease   Intensive Behavioral Therapy for Obesity  Face-to-face counseling, first month every week, month 2-6 every other week, month 7-12 every month if continued progress is documented Optional for those with Body Mass Index 30 or higher  Your Body mass index is 24.79 kg/m.   Tobacco Cessation (Quitting) Counseling   Covers up to 8 smoking and tobacco-use cessation counseling sessions in a 12-month period.    Optional for those that use tobacco   Cancer Screening Last Completion Date   Colorectal screening   For anyone age 24 to 46 or any age if high risk:  Screening  Colonoscopy every 10 yrs if low risk,  more frequent if higher risk  OR  Cologuard Stool DNA test once every 3 years OR  Fecal Occult Blood Testing yearly OR  Flexible  Sigmoidoscopy  every 5 yr OR  CT Colonography every 5 yrs      See below for due date if applicable.   Screening Pap Test   Recommended every 3 years for all women age 45 to 4, or every five years if combined with HPV test (routine screening not needed after total hysterectomy).  Medicare covers every 2 years or yearly if high risk.  Screening Pelvic Exam   Medicare covers  every 2 years, yearly if high risk or childbearing age with abnormal Pap in last 3 yrs.     See below for due date if applicable.   Screening Mammogram   Recommended every 2 years for women age 39 to 41, or more frequent if you have a higher risk. Selectively recommended for women between 40-49 based on shared decisions about risk. Covered by Medicare up to every year for women age 8 or older   See below for due date if applicable.         Lung Cancer Screening  Annual low dose computed tomography (LDCT scan) is recommended for those age 2-80 who smoked 20 pack-years and are current smokers or quit smoking within past 15 years, after counseling by your doctor or nurse clinician about the possible benefits or harms.     See below for due date if applicable.   Vaccinations   Respiratory syncytial virus (RSV)  Age 11 years or older: Based on shared clinical decision-making with your provider.  Pneumococcal Vaccine  Recommended routinely age 41+ with one or two separate vaccines based on your risk. Recommended before age 2 if medical conditions with increased risk  Seasonal Influenza Vaccine  Once every flu season   Hepatitis B Vaccine  3 doses if risk (including anyone with diabetes or liver disease)  Shingles Vaccine  Two doses at age 71 or older  Diphtheria Tetanus Pertussis Vaccine  ONCE as adult, booster every 10 years     Immunization History   Administered Date(s)  Administered    AFLURIA-Influenza Vaccine (3 Yr+)(Admin) 08/31/2008, 09/04/2012    AREXVY (ADULT RSV) 09/20/2022    Covid-19 Floria Reasons Bivalent 08/31/2021    Covid-19 Vaccine,Moderna,12 Years+ 12/25/2019, 12/26/2019, 01/30/2020    Covid-19 Vaccine,Pfizer(Comirnaty),19mcg/0.3ml,12 yr+,Seasonal 08/22/2023    High-Dose Influenza Vaccine, 65+ 08/17/2013, 08/24/2017, 07/28/2020, 08/22/2021, 09/14/2022    PREVNAR 20 09/11/2023    ZOSTAVAX (VARICELLA ZOSTER VACCINE) 09/11/2012     Shingles vaccine and Diphtheria Tetanus Pertussis vaccines are available at pharmacies or local health department without a prescription.   Other Preventative Screening  Last Completion Date   Bone Densitometry   Screening: All females ages 53 and older every 10 years if initial screening normal. Postmenopausal women ages 37-64 need screening with one or more risk factor: previous fracture, parental hip fracture, current smoker, low body weight, excessive alcohol use, Rheumatoid Arthritis   For women with diagnosed Osteoporosis, follow up is recommended every 2 years or a frequency recommended by your provider.     --07/02/2023  See below for due date if applicable.     Glaucoma Screening   Yearly if in high risk group such as diabetes, family history, African American age 80+ or Hispanic American age 41+   See your eye care provider for screening.   Hepatitis C Screening   Recommended  for those born between ages 18-79 years.     See below for due date if applicable.     HIV Testing  Recommended routinely at least ONCE, covered every year for age 75 to 19 regardless of risk, and every year for age over 73 who ask for the test or higher risk. Yearly or up to 3 times in pregnancy         See below for due date if applicable.   Abdominal Aortic Aneurysm Screening Ultrasound   Once with a family history of abdominal aortic aneurysms OR a female between 4-75 and have smoked at least 100 cigarettes in your lifetime.  See below for due date  if applicable.       Your Personalized Schedule for Preventive Tests     Health Maintenance: Pending and Last Completed         Date Due Completion Date    Adult Tdap-Td (1 - Tdap) Done today ---    Medicare Annual Wellness Visit - Calendar Year Insurers Done today ---    Shingles Vaccine (2 of 3) 07/08/2025 (Originally 11/06/2012) 09/11/2012    Influenza Vaccine (1) 08/04/2024 09/14/2022    Diabetic A1C 12/02/2024 06/02/2024    Diabetic Kidney Health eGFR 06/02/2025 06/02/2024    Diabetic Kidney Health Microalb/Cr Ratio 06/02/2025 06/02/2024    Diabetic Retinal Exam 06/18/2025 06/18/2024    Osteoporosis screening 07/01/2025 07/02/2023    Depression Screening 07/08/2025 07/08/2024                  For Information on Advanced Directives for Health Care:  [place]:  [email or web address]  [place], [place], [place], [place] General Information: [email or web address]       Jhonny Romano, DO

## 2024-07-15 ENCOUNTER — Other Ambulatory Visit (RURAL_HEALTH_CENTER): Payer: Self-pay

## 2024-08-28 ENCOUNTER — Other Ambulatory Visit (RURAL_HEALTH_CENTER): Payer: Self-pay | Admitting: Family Medicine

## 2024-10-10 ENCOUNTER — Other Ambulatory Visit (INDEPENDENT_AMBULATORY_CARE_PROVIDER_SITE_OTHER): Payer: Self-pay | Admitting: NURSE PRACTITIONER

## 2024-10-10 DIAGNOSIS — Z79811 Long term (current) use of aromatase inhibitors: Secondary | ICD-10-CM

## 2024-10-10 DIAGNOSIS — Z170 Estrogen receptor positive status [ER+]: Secondary | ICD-10-CM

## 2024-10-13 ENCOUNTER — Encounter (INDEPENDENT_AMBULATORY_CARE_PROVIDER_SITE_OTHER): Payer: Self-pay | Admitting: NURSE PRACTITIONER

## 2024-10-13 NOTE — Nursing Note (Signed)
 Pt called to make sure she had appt tm with Randine NP, discussed is tomorrow at 10am, pt states she will be here.

## 2024-10-14 ENCOUNTER — Encounter (INDEPENDENT_AMBULATORY_CARE_PROVIDER_SITE_OTHER): Payer: Self-pay | Admitting: NURSE PRACTITIONER

## 2024-10-14 ENCOUNTER — Other Ambulatory Visit: Payer: Self-pay

## 2024-10-14 ENCOUNTER — Encounter (INDEPENDENT_AMBULATORY_CARE_PROVIDER_SITE_OTHER): Payer: Self-pay | Admitting: NEUROLOGY

## 2024-10-14 ENCOUNTER — Ambulatory Visit: Payer: Self-pay | Attending: NURSE PRACTITIONER | Admitting: NURSE PRACTITIONER

## 2024-10-14 ENCOUNTER — Ambulatory Visit (INDEPENDENT_AMBULATORY_CARE_PROVIDER_SITE_OTHER): Admission: RE | Admit: 2024-10-14 | Discharge: 2024-10-14 | Payer: Self-pay | Attending: NURSE PRACTITIONER

## 2024-10-14 DIAGNOSIS — Z1721 Progesterone receptor positive status: Secondary | ICD-10-CM | POA: Insufficient documentation

## 2024-10-14 DIAGNOSIS — C50412 Malignant neoplasm of upper-outer quadrant of left female breast: Secondary | ICD-10-CM | POA: Insufficient documentation

## 2024-10-14 DIAGNOSIS — K5909 Other constipation: Secondary | ICD-10-CM | POA: Insufficient documentation

## 2024-10-14 DIAGNOSIS — Z170 Estrogen receptor positive status [ER+]: Secondary | ICD-10-CM | POA: Insufficient documentation

## 2024-10-14 DIAGNOSIS — Z79811 Long term (current) use of aromatase inhibitors: Secondary | ICD-10-CM

## 2024-10-14 DIAGNOSIS — Z1732 Human epidermal growth factor receptor 2 negative status: Secondary | ICD-10-CM | POA: Insufficient documentation

## 2024-10-14 DIAGNOSIS — M8580 Other specified disorders of bone density and structure, unspecified site: Secondary | ICD-10-CM | POA: Insufficient documentation

## 2024-10-14 DIAGNOSIS — M1990 Unspecified osteoarthritis, unspecified site: Secondary | ICD-10-CM | POA: Insufficient documentation

## 2024-10-14 DIAGNOSIS — K5730 Diverticulosis of large intestine without perforation or abscess without bleeding: Secondary | ICD-10-CM | POA: Insufficient documentation

## 2024-10-14 LAB — CBC
HCT: 40.2 % (ref 31.2–41.9)
HGB: 14 g/dL (ref 10.9–14.3)
MCH: 33.5 pg — ABNORMAL HIGH (ref 24.7–32.8)
MCHC: 34.9 g/dL (ref 32.3–35.6)
MCV: 96 fL — ABNORMAL HIGH (ref 75.5–95.3)
MPV: 7.5 fL — ABNORMAL LOW (ref 7.9–10.8)
PLATELETS: 162 x10ˆ3/uL (ref 140–440)
RBC: 4.19 x10ˆ6/uL (ref 3.63–4.92)
RDW: 13.1 % (ref 12.3–17.7)
WBC: 5.3 x10ˆ3/uL (ref 3.8–11.8)

## 2024-10-14 LAB — COMPREHENSIVE METABOLIC PANEL, NON-FASTING
ALBUMIN/GLOBULIN RATIO: 1.7 — ABNORMAL HIGH (ref 0.8–1.4)
ALBUMIN: 4.5 g/dL (ref 3.5–5.7)
ALKALINE PHOSPHATASE: 44 U/L (ref 34–104)
ALT (SGPT): 13 U/L (ref 7–52)
ANION GAP: 7 mmol/L (ref 4–13)
AST (SGOT): 16 U/L (ref 13–39)
BILIRUBIN TOTAL: 0.6 mg/dL (ref 0.3–1.0)
BUN/CREA RATIO: 14 (ref 6–22)
BUN: 15 mg/dL (ref 7–25)
CALCIUM, CORRECTED: 9.4 mg/dL (ref 8.9–10.8)
CALCIUM: 9.8 mg/dL (ref 8.6–10.3)
CHLORIDE: 106 mmol/L (ref 98–107)
CO2 TOTAL: 25 mmol/L (ref 21–31)
CREATININE: 1.1 mg/dL (ref 0.60–1.30)
ESTIMATED GFR: 52 mL/min/1.73mˆ2 — ABNORMAL LOW (ref >59)
GLOBULIN: 2.6 (ref 2.0–3.5)
GLUCOSE: 176 mg/dL — ABNORMAL HIGH (ref 74–109)
OSMOLALITY, CALCULATED: 281 mosm/kg (ref 270–290)
POTASSIUM: 3.8 mmol/L (ref 3.5–5.1)
PROTEIN TOTAL: 7.1 g/dL (ref 6.4–8.9)
SODIUM: 138 mmol/L (ref 136–145)

## 2024-10-14 MED ORDER — ANASTROZOLE 1 MG TABLET
1.0000 mg | ORAL_TABLET | Freq: Every day | ORAL | 3 refills | Status: AC
Start: 2024-10-14 — End: ?

## 2024-10-14 NOTE — Cancer Center Note (Signed)
 Kari Gordon  [id number]  August 30, 1946   10/14/2024       Department of Hematology/Oncology  Return Patient Visit           REFERRING PROVIDER:  Chesley Million, DO  [address]  [place],  [place] [address]    PCP:  Million Chesley, DO  [address]  [place] [address]          REASON FOR OFFICE VISIT:  Ongoing management and evaluation of  Left Breast cancer DX 2017      HISTORY OF PRESENT ILLNESS:  History of Present Illness  Kari Gordon is a 78 year old female with left breast cancer who presents for follow-up.    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 2017, ER/PR positive, HER2 negative, T1C N0 M0  - On Arimidex  since diagnosis; adheres to medication with occasional missed doses  - Obtains medication via mail order  - Mammogram in July 2025 was benign  - No new breast lumps or bumps  - No chest pain or shortness of breath, except with extensive walking    Musculoskeletal symptoms  - Joint pain attributed to arthritis  - No unusual bone pain    Neuropathic facial sensation  - History of trigeminal nerve surgery in 2022 with significant relief of prior severe pain  - Occasional facial sensation when touching her face, but no pain    Gastrointestinal symptoms  - History of diverticulosis  - Constipation that is difficult to manage  - Difficulty balancing between constipation and diarrhea    Bone health  - Bone density scan performed in July 2024  - Taking calcitriol  - Not currently taking calcium supplements    Constitutional and neurological symptoms  - No nausea, vomiting, or diarrhea  - No changes in vision  - No changes in headache          REVIEW OF SYSTEMS:    Past Medical History:   Diagnosis Date    Alopecia 03/22/2022    Cancer (CMS HCC)     Diabetes mellitus, type 2     Dysuria 03/22/2022    Esophageal reflux     Essential hypertension     Fibromyalgia     Herpes zoster 03/22/2022    Hx of breast cancer     Insomnia 03/22/2022    Nerve damage     Osteoarthritis      Sleep apnea     Type 2 diabetes, diet controlled (CMS HCC) 03/22/2022    Unspecified glaucoma(365.9)     Uterine cancer     UTI (urinary tract infection) 03/22/2022           Past Surgical History:   Procedure Laterality Date    BILATERAL OOPHORECTOMY      COLONOSCOPY      dr sharron 04/25/22 diverticular disease    EYE SURGERY      cornea and cataract surgery 12/29/21 left 05/04/22 right    HX BREAST LUMPECTOMY      HX CHOLECYSTECTOMY      HX HEART CATHETERIZATION      HX HYSTERECTOMY      LYMPH NODE BIOPSY      NERVE SURGERY      NOSE SURGERY      UTERINE FIBROID SURGERY             Social History     Socioeconomic History    Marital status: Married     Spouse name:  Not on file    Number of children: 2    Years of education: Not on file    Highest education level: Not on file   Occupational History    Not on file   Tobacco Use    Smoking status: Never    Smokeless tobacco: Never   Vaping Use    Vaping status: Never Used   Substance and Sexual Activity    Alcohol use: Never    Drug use: Never    Sexual activity: Not Currently     Partners: Male   Other Topics Concern    Not on file   Social History Narrative    Not on file     Social Determinants of Health     Financial Resource Strain: Not on file   Transportation Needs: Not on file   Social Connections: Not on file   Intimate Partner Violence: Not on file   Housing Stability: Not on file       Social History     Social History Narrative    Not on file       Social History     Substance and Sexual Activity   Drug Use Never       Family Medical History:       Problem Relation (Age of Onset)    Glaucoma Brother    Heart Disease Mother, Father    Hypertension (High Blood Pressure) Mother, Father    Stroke Mother, Father              Current Outpatient Medications   Medication Sig    Amlodipine -Valsartan  (EXFORGE ) 5-320 mg Oral Tablet Take 1 Tablet by mouth Daily Indications: high blood pressure    anastrozole  (ARIMIDEX ) 1 mg Oral Tablet Take 1 Tablet (1 mg total) by  mouth Daily    ascorbic acid (VITAMIN C) 1,000 mg Oral Tablet Take 1 Tablet (1,000 mg total) by mouth Daily    aspirin (ECOTRIN) 81 mg Oral Tablet, Delayed Release (E.C.) Take 1 Tablet (81 mg total) by mouth    atorvastatin  (LIPITOR) 40 mg Oral Tablet Take 1 Tablet (40 mg total) by mouth Every evening    cholecalciferol, vitamin D3, 25 mcg (1,000 unit) Oral Tablet Take 1 Tablet (1,000 Units total) by mouth Daily    Clobetasol  0.05 % Gel Insert into the vagina Once a day    Coenzyme Q10 (CO Q-10) 10 mg Oral Capsule Take by mouth Twice daily    cyclobenzaprine  (FLEXERIL ) 5 mg Oral Tablet Take 1 Tablet (5 mg total) by mouth Every 8 hours as needed for Muscle spasms    empagliflozin  (JARDIANCE ) 10 mg Oral Tablet Take 1 Tablet (10 mg total) by mouth Daily    famotidine  (PEPCID ) 40 mg Oral Tablet Take 1 Tablet (40 mg total) by mouth Twice daily    fremanezumab -vfrm (AJOVY  AUTOINJECTOR) 225 mg/1.5 mL Subcutaneous Auto-Injector Inject 1.5 mL (225 mg total) under the skin Every 30 days for 180 days    ketoconazole (NIZORAL) 2 % Cream Apply topically    latanoprost (XALATAN) 0.005 % Ophthalmic Drops     levothyroxine  (SYNTHROID ) 25 mcg Oral Tablet Take 1 Tablet (25 mcg total) by mouth Every morning    omega-3/dha/epa/fish oil (OMEGA-3 FISH OIL ORAL) Take 1 Tablet by mouth Once a day For joints  omega XL (Patient taking differently: Take 2 Tablets by mouth Twice daily For joints  omega XL)    omeprazole  (PRILOSEC) 20 mg Oral Capsule, Delayed Release(E.C.) Take 1 Capsule (  20 mg total) by mouth Once a day    prednisoLONE acetate (PRED FORTE) 1 % Ophthalmic Drops, Suspension     Resorcinol-Petro-Cala-Zinc-Lan (RESINOL) 2-55 % Ointment Apply topically Three times a day    sennosides-docusate sodium  (SENOKOT-S) 8.6-50 mg Oral Tablet Take 2 Tablets by mouth Every night    sucralfate  (CARAFATE ) 1 gram Oral Tablet Take 1 Tablet (1 g total) by mouth Four times a day - before meals and bedtime    UBRELVY  100 mg Oral Tablet When needed     vitamin E 400 unit Oral Capsule Take 1 Capsule (400 Units total) by mouth Daily    zolpidem  (AMBIEN ) 10 mg Oral Tablet Take 1 Tablet (10 mg total) by mouth Every night as needed for Insomnia       Allergies[1]      PHYSICAL EXAM:  BP 123/75 (Site: Right Arm, Patient Position: Sitting, Cuff Size: Adult)   Pulse (!) 102   Temp 36.8 C (98.2 F) (Temporal)   Ht 1.651 m (5' 5)   Wt 66.8 kg (147 lb 4.8 oz)   SpO2 99%   BMI 24.51 kg/m        ECOG Status: (0) Fully active, able to carry on all predisease performance without restriction   Physical Exam  GENERAL: Alert, cooperative, well developed, no acute distress  HEENT: Normocephalic, normal oropharynx, moist mucous membranes  CHEST: Clear to auscultation bilaterally, no wheezes, rhonchi, or crackles  CARDIOVASCULAR: Normal heart rate and rhythm, S1 and S2 normal without murmurs  ABDOMEN: Soft, non-tender, non-distended, without organomegaly, normal bowel sounds  EXTREMITIES: No cyanosis or edema  NEUROLOGICAL: Cranial nerves grossly intact, moves all extremities without gross motor or sensory deficit      LABS:   Results  LABS  CBC: WBC 5.3, Hb 14, PLT 162 (10/14/2024)  CA 27-29: 13    RADIOLOGY  Mammogram: Benign (06/10/2024)  Bone Density: Osteopenia (07/02/2023)       ASSESSMENT:    ICD-10-CM    1. Malignant neoplasm of upper-outer quadrant of left breast in female, estrogen receptor positive (CMS HCC)  C50.412 anastrozole  (ARIMIDEX ) 1 mg Oral Tablet    Z17.0 CBC     COMPREHENSIVE METABOLIC PANEL, NON-FASTING    Diagnosed 2017, ER PR positive HER2 negative T1c N0 M0      2. 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 Currently on Arimidex   with no reported issues. Recent mammogram in July 2025 was benign. No new lumps, chest pain, or significant symptoms. CA 27-29 tumor marker was 13.   - Continue Arimidex  therapy  - Sent prescription for Arimidex  to mail order pharmacy    Osteopenia  Currently taking calcitriol but not calcium supplementation.  - Start calcium supplementation, 500-600 mg daily    Constipation  Chronic constipation with diverticulosis. Reports bowel movement issues but no acute distress.    Osteoarthritis  Joint pain attributed to osteoarthritis. No new symptoms reported. Discussed use of paraffin wax for joint pain relief.    History of trigeminal nerve surgery  Trigeminal nerve surgery in 2022 with significant improvement in pain. Occasional mild symptoms but no major issues reported.    Return in about 1  year (around 10/14/2025).    Kari Gordon was given the chance to ask questions, and these were answered to their satisfaction. The patient is welcome to call with any questions or concerns in the meantime.     This note was created using [employer] M*Modal Fluency Align mobile application via conversational audio. Consent for audio recording was obtained by patient/family members prior to recording.      Randine Clara APRN, FNP-BC, AOCNP, 10/14/2024 , 10:39       This note was partially generated using MModal Fluency Direct system, and there may be some incorrect words, spellings, and punctuation that were not noted in checking the note before saving.   You can see your note(s) in [facility]. It is common for you to encounter certain medical terminology which may be unfamiliar to you. You might see results before your provider does so please give at least 2 business days for review. Please have this understanding, that NOT all abnormal results are significant. Our office will contact you for any urgent or emergent action if necessary. If you have any questions or concerns, feel free to send a MyChart message or call the  office. Please call with any new or concerning symptoms.     CC:  Jhonny Romano, DO  [address]  [place] [address]                           [1]   Allergies  Allergen Reactions    Hydrocodone-Acetaminophen Itching and Nausea/ Vomiting     headache  headache      Sulfa (Sulfonamides) Rash    Duloxetine Mental Status Effect    Erythromycin Lactobionate     Erythromycin Nausea/ Vomiting

## 2024-10-14 NOTE — Progress Notes (Deleted)
 ASSESSMENT  MIGRAINE WITHOUT AURA:  She is a 78 year old woman who follows-up for longstanding history of headaches.  Headaches meet criteria for migraine.  They are unilateral, associated with photo and phonophobia, nausea and worsened with activity.  She currently has seen a significant reduction in frequency since being on Ajovy  with Ubrelvy  as abortive.  MRI of the brain is largely unremarkable.  Neurologic exam is nonfocal.  She has at this point failed multiple classes of medications including beta-blockers, ACE inhibitors, SNRI, antiepileptics and CGRP antagonists.  Ajovy  has improved her frequency and severity and Ubrelvy  is helpful if she can take it and rest.  She is, however, having a significant period of wearing off each month prior to when her Ajovy  dose is due. Today we discussed possible trial of the increased dose of Ajovy  that is dosed every 3 months as a way to minimize wearing off period.   POSSIBLE PRIMARY STABBING HA:  In addition to above, she reports brief stabbing pains that recur throughout the day at times that seem to occur at any time of the month. These have subsided in recent months.   LOWER EXTREMITY PAIN: She reports many years of lower extremity pain that radiates down the side of the legs all the way to the ankle and foot at times. This sounds most concerning for radiculopathy though the character of the pain does not appear to be neuropathic in nature. She has failed physical therapy at this point. We will obtain EMG as well as MRI of the lumbar spine.     PLAN  1. Trial Ajovy  675mg  every 3 months      Continue Ubrelvy  p.r.n.      Continue Flexeril  5mg  PRN      Counseled on medication overuse headache      Encouraged aggressive hydration  2. Continue to monitor  3. Obtain MRI lumbar spine       Obtain EMG    RTC for electrodiagnostic testing.     Thank you for allowing me to participate in your patient's care and please do not hesitate to contact me for any questions or  concerns.    CANDIE Darlyn Free, DO  Assistant Professor of Neurology  [place]  [facility]     [phone]: I will continue to be the provider focal point in managing the chronic complex neurological condition  ==========================================================================================================================================    NAME:  Kari Gordon  DOB:  12/04/1946  VISIT DATE:  09/29/2022    CC:  Migraine    Patient seen in consultation at the request of Dr. Jhonny Romano  History obtained from the patient and chart/records  Age of patient:  78 y.o.    INTERVAL: Since last visit, she has been doing relatively well. Still has some wearing off, dose wasn't increased as she still had many refills of prior dose. Stabbing pain has largely resolved. She does wish to discuss significant lower extremity pain which has been ongoing for multiple decades. This is described as aching pain in the lateral aspect of the thighs that can extend down the lower leg. It happens bilaterally though right is worse. She does have history of FM.     HPI:   I had the pleasure of seeing your patient in neurology clinic for an outpatient consultation, who is a 78 y.o. year old female who was referred for evaluation of trigeminal neuralgia.  Please allow me to summarize the history for the record.    She states she  has a longstanding history of migraine dating back to childhood.  She states that they are characterized by right neck pain that radiate into the temporal.  At times she can have headaches that begin between the eyes.  They are associated with photo and phonophobia as well as nausea.  They are worsened with activity.  She does have sensitivity to smells as well.  These headaches can last days.  She typically has more mild headaches on a daily basis and then we will have 2-3 incapacitating headaches per month that last multiple days.  She is taking Tylenol on a daily basis.  Currently  she is on Ajovy  and has been for the past 3 months and finds it somewhat helpful in that she will get some headache-free days for a few days following a dose.  She is using Ubrelvy  as an abortive treatment.  This has been quite helpful and she can often curtail the stronger headaches with this medication.  She is currently reserving the use of this for most severe headaches.  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  This started in 2019 or 2020.  Angiography revealed contact with the nerve by nearby vessel.  She was referred to [facility] and underwent neurosurgical procedure to ameliorate her symptoms.  This was successful and she is not had symptoms since the procedure.  Had trigeminal neuralgia w neurologist to this point.  hich started in 2019 or 2020. Used medications which were not helpful. Then was referred to [facility] by Dr. Ardis and had surgery which was successful.     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    Abnormal cardiovascular stress test    UTI (urinary tract infection)    Alopecia    Insomnia    Herpes zoster    Dysuria    Type 2 diabetes, diet controlled (CMS HCC)    Hyperlipidemia LDL goal <70    CKD (chronic kidney disease), symptom management only, stage 2 (mild)    Vitamin D  deficiency    Polyneuropathy due to type 2 diabetes mellitus  (CMS HCC)     Past Surgical History:   Procedure Laterality Date    BILATERAL OOPHORECTOMY      COLONOSCOPY      dr sharron 04/25/22 diverticular disease    EYE SURGERY      cornea and cataract surgery 12/29/21 left 05/04/22 right    HX BREAST LUMPECTOMY      HX CHOLECYSTECTOMY      HX HEART CATHETERIZATION      HX HYSTERECTOMY      LYMPH NODE BIOPSY      NERVE SURGERY      NOSE SURGERY      UTERINE FIBROID SURGERY           Family Medical History:  Problem Relation (Age of Onset)    Glaucoma Brother    Heart Disease Mother, Father    Hypertension (High Blood Pressure) Mother, Father    Stroke Mother, Father            Current Outpatient Medications   Medication Sig Dispense Refill    Amlodipine -Valsartan  (EXFORGE ) 5-320 mg Oral Tablet Take 1 Tablet by mouth Daily Indications: high blood pressure 90 Tablet 3    anastrozole  (ARIMIDEX ) 1 mg Oral Tablet Take 1 Tablet (1 mg total) by mouth Once a day 90 Tablet 3    aspirin (ECOTRIN) 81 mg Oral Tablet, Delayed Release (E.C.) Take 1 Tablet (81 mg total) by mouth      atorvastatin  (LIPITOR) 40 mg Oral Tablet Take 1 Tablet (40 mg total) by mouth Every evening 90 Tablet 3    cholecalciferol, vitamin D3, 25 mcg (1,000 unit) Oral Tablet Take 1 Tablet (1,000 Units total) by mouth Daily      Clobetasol  0.05 % Gel Insert into the vagina Once a day 30 g 11    Coenzyme Q10 (CO Q-10) 10 mg Oral Capsule Take by mouth Twice daily      cyclobenzaprine  (FLEXERIL ) 5 mg Oral Tablet Take 1 Tablet (5 mg total) by mouth Every 8 hours as needed for Muscle spasms 90 Tablet 3    empagliflozin  (JARDIANCE ) 10 mg Oral Tablet Take 1 Tablet (10 mg total) by mouth Daily 90 Tablet 4    famotidine  (PEPCID ) 40 mg Oral Tablet Take 1 Tablet (40 mg total) by mouth Twice daily 180 Tablet 3    fremanezumab -vfrm (AJOVY  AUTOINJECTOR) 225 mg/1.5 mL Subcutaneous Auto-Injector Inject 1.5 mL (225 mg total) under the skin Every 30 days for 180 days 9 mL 0    ketoconazole (NIZORAL) 2 % Cream Apply topically       latanoprost (XALATAN) 0.005 % Ophthalmic Drops       levothyroxine  (SYNTHROID ) 25 mcg Oral Tablet Take 1 Tablet (25 mcg total) by mouth Every morning 90 Tablet 3    omega-3/dha/epa/fish oil (OMEGA-3 FISH OIL ORAL) Take 1 Tablet by mouth Once a day For joints  omega XL (Patient taking differently: Take 2 Tablets by mouth Twice daily For joints  omega XL)      omeprazole  (PRILOSEC) 20 mg Oral Capsule, Delayed Release(E.C.) Take 1 Capsule (20 mg total) by mouth Once a day 90 Capsule 3    prednisoLONE acetate (PRED FORTE) 1 % Ophthalmic Drops, Suspension       Resorcinol-Petro-Cala-Zinc-Lan (RESINOL) 2-55 % Ointment Apply topically Three times a day 50 g 3    sennosides-docusate sodium  (SENOKOT-S) 8.6-50 mg Oral Tablet Take 2 Tablets by mouth Every night 180 Tablet 3    sucralfate  (CARAFATE ) 1 gram Oral Tablet Take 1 Tablet (1 g total) by mouth Four times a day - before meals and bedtime 180 Tablet 11    zolpidem  (AMBIEN ) 10 mg Oral Tablet Take 1 Tablet (10 mg total) by mouth Every night as needed for Insomnia 90 Tablet 1     No current facility-administered medications for this visit.     Allergies   Allergen Reactions    Hydrocodone-Acetaminophen Itching and Nausea/ Vomiting     headache  headache      Sulfa (Sulfonamides) Rash    Duloxetine Mental Status Effect    Erythromycin Lactobionate     Erythromycin Nausea/ Vomiting     Social History     Socioeconomic History    Marital status: Married  Spouse name: Not on file    Number of children: 2    Years of education: Not on file    Highest education level: Not on file   Occupational History    Not on file   Tobacco Use    Smoking status: Never    Smokeless tobacco: Never   Vaping Use    Vaping status: Never Used   Substance and Sexual Activity    Alcohol use: Never    Drug use: Never    Sexual activity: Not Currently     Partners: Male   Other Topics Concern    Not on file   Social History Narrative    Not on file     Social Determinants of Health     Financial  Resource Strain: Not on file   Transportation Needs: Not on file   Social Connections: Not on file   Intimate Partner Violence: Not on file   Housing Stability: Not on file       ============================================================================================================================================  GENERAL EXAMINATION  There were no vitals taken for this visit.    Vital signs personally reviewed  General: No acute distress, alert  HEENT: Normocephalic, no scleral icterus  Extremities: No significant edema, No cyanosis    NEUROLOGIC EXAM  On neurological exam, patient was awake, alert and answering questions appropriately  Speech was fluent, without dysarthria or aphasia.    CN  II-XII: grossly intact    MOTOR  Bulk: normal  Abnormal Movements: none    Strength:     MRC Grading Scale   Right Left   Deltoid 5 5   Biceps 5 5   Triceps 5 5   Wrist Extension - -   Wrist Flexion - -   Finger Extension - -   Finger Abduction - -   Finger Flexion - -   Hip Flexion 5 5   Hip Extension - -   Hip Abduction - -   Hip Adduction - -   Knee Extension 5 5   Knee Flexion 5 5   Ankle Dorsiflexion - -   Ankle Plantarflexion - -   Toe Extension - -   Toe Flexion - -     REFLEXES   Right Left   Biceps 3 3   Triceps 2 2   Brachioradialis 2 2   Patellar 2 (brisk) 2 (brisk)   Achilles 2 2   Plantar - -   Hoffman - -   Pectoralis - -   Jaw Jerk - -       SENSORY  Light touch:  Intact throughout    GAIT  General: casual, normal gait    ================================================================================================================================LABS  Personal Review of prior labs is notable for:   2024  TSH normal  Anti CCP negative   ANA negative  CMP largely within normal limits  CBC largely within normal  2023  TSH within normal limits   A1c 6.1   CMP largely within normal limits   CBC largely within normal limits  IMAGING  Personal Review of imaging is notable for:   MRI Lumbar Spine June 2025 - no  significant neuroforaminal or central canal narrowing    MRI of the brain without contrast July 03, 2022  Scattered FLAIR hyperintensities consistent with chronic white matter disease, relatively well-preserved volume for age    OTHER DIAGNOSTICS  Personal Review of other prior diagnostics is notable for:  NCS/EMG July 2025  IMPRESSION:  This is a normal study.  Specifically, there is no electrophysiologic evidence for a large fiber polyneuropathy at this time.

## 2024-10-16 LAB — CA 27,29: CA 27.29: 13 U/mL (ref <38)

## 2024-10-17 ENCOUNTER — Ambulatory Visit (INDEPENDENT_AMBULATORY_CARE_PROVIDER_SITE_OTHER): Payer: Self-pay | Admitting: NURSE PRACTITIONER

## 2024-10-20 ENCOUNTER — Ambulatory Visit (HOSPITAL_COMMUNITY): Admit: 2024-10-20 | Admitting: Surgery

## 2024-10-20 ENCOUNTER — Encounter (HOSPITAL_COMMUNITY): Payer: Self-pay

## 2024-10-20 SURGERY — GASTROSCOPY
Anesthesia: General

## 2024-11-03 ENCOUNTER — Other Ambulatory Visit: Payer: Self-pay

## 2024-11-03 ENCOUNTER — Ambulatory Visit (RURAL_HEALTH_CENTER): Admitting: Family

## 2024-11-03 ENCOUNTER — Encounter (RURAL_HEALTH_CENTER): Payer: Self-pay | Admitting: Family

## 2024-11-03 VITALS — BP 117/70 | HR 96 | Temp 98.4°F | Resp 18 | Ht 65.0 in | Wt 147.2 lb

## 2024-11-03 DIAGNOSIS — B9689 Other specified bacterial agents as the cause of diseases classified elsewhere: Secondary | ICD-10-CM

## 2024-11-03 DIAGNOSIS — R0981 Nasal congestion: Secondary | ICD-10-CM

## 2024-11-03 DIAGNOSIS — R059 Cough, unspecified: Secondary | ICD-10-CM

## 2024-11-03 LAB — POCT RAPID COVID-19 & FLU (AMB ONLY)
COVID-19 AG: NEGATIVE
INFLUENZA TYPE A: NEGATIVE
INFLUENZA TYPE B: NEGATIVE

## 2024-11-03 LAB — POCT RAPID STREP A: RAPID STREP A (POCT): NEGATIVE

## 2024-11-03 MED ORDER — FLUTICASONE PROPIONATE 50 MCG/ACTUATION NASAL SPRAY,SUSPENSION: 2.0000 | Freq: Two times a day (BID) | NASAL | 1 refills | Status: AC

## 2024-11-03 MED ORDER — DOXYCYCLINE HYCLATE 100 MG CAPSULE: 100.0000 mg | ORAL_CAPSULE | Freq: Two times a day (BID) | ORAL | 0 refills | Status: AC

## 2024-11-03 MED ORDER — GUAIFENESIN ER 600 MG TABLET, EXTENDED RELEASE 12 HR: 1200.0000 mg | EXTENDED_RELEASE_TABLET | Freq: Two times a day (BID) | ORAL | Status: AC

## 2024-11-03 NOTE — Nursing Note (Signed)
 Patient here today with complaints of cough,sore throat, chills,congestion, and headache that started 16 days ago.

## 2024-11-03 NOTE — Progress Notes (Signed)
 FAMILY MEDICINE, [facility]  [address]  [place] [address]  Operated by [facility]     Name: Kari Gordon MRN:  [id number]   Date of Birth: [birth year] Age: 78 y.o.   Date: 11/03/2024  Time: 11:01     Provider: Greig LITTIE Mills, APRN, CNP    Reason for visit: Feel Sick        History of Present Illness:   History of Present Illness  Kari Gordon is a 78 year old female who presents with persistent cold and flu symptoms for over two weeks.    She has been experiencing cold and flu-like symptoms for over 17 days, including a sore throat, runny nose, and chills. Initially, she thought it was a sinus infection due to facial pressure, but this symptom has since resolved. She now experiences nasal congestion and is coughing up a lot of phlegm.    She has been taking allergy medicine and Mucinex , including both regular and DM formulations. However, these treatments have not significantly improved her condition. She denies fever, noting her temperature remains around her normal baseline of 75F, though she experiences occasional chills.    She has a history of allergies and reports slight ear pain. She has not been using Flonase  recently, although she has some at home. She denies smoking and has a history of back problems, which affects her ability to rest comfortably in bed, preferring to rest in a chair.       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  Added automatically from request for surgery [id number]      Chronic GERD 02/15/2021    Hypothyroidism 02/15/2021    Right trigeminal neuralgia 02/01/2021    Lichen sclerosus et atrophicus 02/17/2013    Abnormal cardiovascular stress test 06/16/2008       Historical Data    Past Medical History:  Past Medical History:   Diagnosis Date    Alopecia 03/22/2022    Cancer (CMS HCC)     Diabetes mellitus, type 2     Dysuria 03/22/2022    Esophageal reflux     Essential hypertension     Fibromyalgia     Herpes zoster 03/22/2022    Hx of breast cancer     Insomnia 03/22/2022    Nerve damage     Osteoarthritis     Sleep apnea     Type 2 diabetes, diet controlled (CMS HCC) 03/22/2022    Unspecified glaucoma(365.9)     Uterine cancer     UTI (urinary tract infection) 03/22/2022  Past Surgical History:  Past Surgical History:   Procedure Laterality Date    BILATERAL OOPHORECTOMY      COLONOSCOPY      dr sharron 04/25/22 diverticular disease    EYE SURGERY      cornea and cataract surgery 12/29/21 left 05/04/22 right    HX BREAST LUMPECTOMY      HX CHOLECYSTECTOMY      HX HEART CATHETERIZATION      HX HYSTERECTOMY      LYMPH NODE BIOPSY      NERVE SURGERY      NOSE SURGERY      UTERINE FIBROID SURGERY       Allergies:  Allergies[1]  Medications:  Current Outpatient Medications   Medication Sig    Amlodipine -Valsartan  (EXFORGE ) 5-320 mg Oral Tablet Take 1 Tablet by mouth Daily Indications: high blood pressure    anastrozole  (ARIMIDEX ) 1 mg Oral Tablet Take 1 Tablet (1 mg total) by mouth Daily    ascorbic acid (VITAMIN C) 1,000 mg Oral Tablet Take 1 Tablet (1,000 mg total) by mouth Daily    aspirin (ECOTRIN) 81 mg Oral Tablet, Delayed Release (E.C.) Take  1 Tablet (81 mg total) by mouth    atorvastatin  (LIPITOR) 40 mg Oral Tablet Take 1 Tablet (40 mg total) by mouth Every evening    calcium carbonate 500 mg calcium (1,250 mg) Oral Tablet Take 1 Tablet (500 mg total) by mouth Daily    cholecalciferol, vitamin D3, 25 mcg (1,000 unit) Oral Tablet Take 1 Tablet (1,000 Units total) by mouth Daily    Clobetasol  0.05 % Gel Insert into the vagina Once a day    Coenzyme Q10 (CO Q-10) 10 mg Oral Capsule Take by mouth Twice daily    cyclobenzaprine  (FLEXERIL ) 5 mg Oral Tablet Take 1 Tablet (5 mg total) by mouth Every 8 hours as needed for Muscle spasms    doxycycline  hyclate (VIBRAMYCIN ) 100 mg Oral Capsule Take 1 Capsule (100 mg total) by mouth Twice daily for 10 days    empagliflozin  (JARDIANCE ) 10 mg Oral Tablet Take 1 Tablet (10 mg total) by mouth Daily    famotidine  (PEPCID ) 40 mg Oral Tablet Take 1 Tablet (40 mg total) by mouth Twice daily    fluticasone  propionate (FLONASE ) 50 mcg/actuation Nasal Spray, Suspension Administer 2 Sprays into each nostril Twice daily for 90 days Decrease to 2 squirts daily after 7 days. Indications: inflammation of the nose due to an allergy    fremanezumab -vfrm (AJOVY  AUTOINJECTOR) 225 mg/1.5 mL Subcutaneous Auto-Injector Inject 1.5 mL (225 mg total) under the skin Every 30 days for 180 days    guaiFENesin  (MUCINEX ) 600 mg Oral Tablet Extended Release 12hr Take 2 Tablets (1,200 mg total) by mouth Twice daily for 7 days    ketoconazole (NIZORAL) 2 % Cream Apply topically    latanoprost (XALATAN) 0.005 % Ophthalmic Drops     levothyroxine  (SYNTHROID ) 25 mcg Oral Tablet Take 1 Tablet (25 mcg total) by mouth Every morning    omega-3/dha/epa/fish oil (OMEGA-3 FISH OIL ORAL) Take 1 Tablet by mouth Once a day For joints  omega XL (Patient taking differently: Take 2 Tablets by mouth Twice daily For joints  omega XL)    omeprazole  (PRILOSEC) 20 mg Oral Capsule, Delayed Release(E.C.) Take 1 Capsule (20 mg total) by mouth Once a day    prednisoLONE  acetate (PRED FORTE) 1 % Ophthalmic Drops, Suspension     Resorcinol-Petro-Cala-Zinc-Lan (RESINOL) 2-55 % Ointment Apply topically Three times a day  sennosides-docusate sodium  (SENOKOT-S) 8.6-50 mg Oral Tablet Take 2 Tablets by mouth Every night    sucralfate  (CARAFATE ) 1 gram Oral Tablet Take 1 Tablet (1 g total) by mouth Four times a day - before meals and bedtime    UBRELVY  100 mg Oral Tablet When needed    vitamin E 400 unit Oral Capsule Take 1 Capsule (400 Units total) by mouth Daily    zolpidem  (AMBIEN ) 10 mg Oral Tablet Take 1 Tablet (10 mg total) by mouth Every night as needed for Insomnia     Family History:  Family Medical History:       Problem Relation (Age of Onset)    Glaucoma Brother    Heart Disease Mother, Father    Hypertension (High Blood Pressure) Mother, Father    Stroke Mother, Father            Social History:  Social History     Socioeconomic History    Marital status: Married    Number of children: 2   Tobacco Use    Smoking status: Never    Smokeless tobacco: Never   Vaping Use    Vaping status: Never Used   Substance and Sexual Activity    Alcohol use: Never    Drug use: Never    Sexual activity: Not Currently     Partners: Male           Review of Systems:  Any pertinent Review of Systems as addressed in the HPI above.    Physical Exam:  Vital Signs:  Vitals:    11/03/24 1012   BP: 117/70   Pulse: 96   Resp: 18   Temp: 36.9 C (98.4 F)   TempSrc: Temporal   SpO2: 99%   Weight: 66.8 kg (147 lb 4 oz)   Height: 1.651 m (5' 5)   BMI: 24.5     Physical Exam  Vitals reviewed.   Constitutional:       Appearance: Normal appearance. She is well-developed, well-groomed and normal weight. She is not ill-appearing (but obviously does not feel well).   HENT:      Head: Normocephalic and atraumatic.      Right Ear: Hearing, tympanic membrane, ear canal and external ear normal. Tympanic membrane is not injected or bulging.      Left Ear: Hearing, tympanic membrane, ear canal and external ear  normal. Tympanic membrane is not injected or bulging.      Nose: Mucosal edema, congestion and rhinorrhea present. Rhinorrhea is clear.      Right Sinus: No maxillary sinus tenderness or frontal sinus tenderness.      Left Sinus: Maxillary sinus tenderness and frontal sinus tenderness present.      Mouth/Throat:      Lips: Pink.      Mouth: Mucous membranes are moist.      Pharynx: Oropharyngeal exudate and posterior oropharyngeal erythema present. No pharyngeal swelling.   Eyes:      General: No scleral icterus.     Conjunctiva/sclera: Conjunctivae normal.   Cardiovascular:      Rate and Rhythm: Normal rate and regular rhythm.      Heart sounds: Normal heart sounds, S1 normal and S2 normal.   Pulmonary:      Effort: Pulmonary effort is normal.      Breath sounds: Normal breath sounds and air entry. No decreased breath sounds, wheezing, rhonchi or rales.      Comments: Lung sounds are good...  Abdominal:  Palpations: Abdomen is soft.   Musculoskeletal:      Cervical back: Neck supple. No muscular tenderness.      Right lower leg: No edema.      Left lower leg: No edema.   Lymphadenopathy:      Cervical: Cervical adenopathy present.   Skin:     General: Skin is warm.      Coloration: Skin is not cyanotic or pale.   Neurological:      Mental Status: She is alert and oriented to person, place, and time.   Psychiatric:         Behavior: Behavior is cooperative.         Results:  Lab Results   Component Value Date    VITAMIND25 54.88 12/12/2023      CBC  Diff   Lab Results   Component Value Date/Time    WBC 5.3 10/14/2024 10:06 AM    HGB 14.0 10/14/2024 10:06 AM    HCT 40.2 10/14/2024 10:06 AM    PLTCNT 162 10/14/2024 10:06 AM    RBC 4.19 10/14/2024 10:06 AM    MCV 96.0 (H) 10/14/2024 10:06 AM    MCHC 34.9 10/14/2024 10:06 AM    MCH 33.5 (H) 10/14/2024 10:06 AM    RDW 13.1 10/14/2024 10:06 AM    MPV 7.5 (L) 10/14/2024 10:06 AM    Lab Results   Component Value Date/Time    PMNS 77 12/12/2023 10:03 AM    LYMPHOCYTES 10  (L) 12/12/2023 10:03 AM    EOSINOPHIL 2 12/12/2023 10:03 AM    MONOCYTES 11 12/12/2023 10:03 AM    BASOPHILS 0 12/12/2023 10:03 AM    BASOPHILS 0.00 12/12/2023 10:03 AM    PMNABS 5.20 12/12/2023 10:03 AM    LYMPHSABS 0.70 (L) 12/12/2023 10:03 AM    EOSABS 0.10 12/12/2023 10:03 AM    MONOSABS 0.70 12/12/2023 10:03 AM           COMPREHENSIVE METABOLIC PANEL  Lab Results   Component Value Date    SODIUM 138 10/14/2024    POTASSIUM 3.8 10/14/2024    CHLORIDE 106 10/14/2024    CO2 25 10/14/2024    ANIONGAP 7 10/14/2024    BUN 15 10/14/2024    CREATININE 1.10 10/14/2024    GLUCOSENF 176 (H) 10/14/2024    CALCIUM 9.8 10/14/2024    ALBUMIN 4.5 10/14/2024    TOTALPROTEIN 7.1 10/14/2024    ALKPHOS 44 10/14/2024    AST 16 10/14/2024    ALT 13 10/14/2024    GFR 52 (L) 10/14/2024                 Lab Results   Component Value Date    CHOLESTEROL 124 06/02/2024    TRIG 133 06/02/2024    HDLCHOL 48 06/02/2024    LDLCHOL 49 06/02/2024    VLDLCAL 27 06/02/2024    CHOLHDLRATIO 2.6 06/02/2024      Lab Results   Component Value Date    TSH 2.354 12/12/2023     Lab Results   Component Value Date    HA1C 6.1 (H) 06/02/2024            ICD-10-CM    1. Acute bacterial sinusitis  J01.90 POCT Rapid Strep    B96.89 POCT Rapid Covid & Flu ([facility])      2. Cough, unspecified type  R05.9 POCT Rapid Strep     POCT Rapid Covid & Flu ([facility])      3. Nasal sinus congestion  R09.81 POCT Rapid Strep     POCT Rapid Covid & Flu [facility]ila)           Assessment/Plan:  Assessment & Plan  Acute bacterial sinusitis with cough and nasal congestion greater than 10 days  Symptoms suggest bacterial sinusitis due to duration and presentation.   Lung sounds are clear without concerns for pneumonia.  Negative for strep, COVID, and flu.  - Prescribed doxycycline  100 mg BID for 10 days.  Take with food to prevent GI upset  - Recommended Mucinex  ER 600 mg, two pills BID for 7 days.  Stay well hydrated to help thin mucus production-   -Advised Flonase  nasal spray, two sprays  each nostril BID for 7 days, then reduce to once daily if symptoms improve.  Try to avoid oral steroids due to type 2 diabetes.           Orders Placed This Encounter    POCT Rapid Strep    POCT Rapid Covid & Flu ([facility]ia)    doxycycline  hyclate (VIBRAMYCIN ) 100 mg Oral Capsule    fluticasone  propionate (FLONASE ) 50 mcg/actuation Nasal Spray, Suspension    guaiFENesin  (MUCINEX ) 600 mg Oral Tablet Extended Release 12hr         Return if symptoms worsen or fail to improve.    Shandiin Eisenbeis L Rachael Zapanta APRN, FNP-C    This note was created with assistance from [employer] via capture of conversational audio.  Consent was obtained from the patient prior to recording.      Portions of this note may be dictated using voice recognition software or a dictation service. Variances in spelling and vocabulary are possible and unintentional. Not all errors are caught/corrected. Please notify the dino if any discrepancies are noted or if the meaning of any statement is not clear.          [1]   Allergies  Allergen Reactions    Hydrocodone-Acetaminophen Itching and Nausea/ Vomiting     headache  headache      Sulfa (Sulfonamides) Rash    Duloxetine Mental Status Effect    Erythromycin Lactobionate     Erythromycin Nausea/ Vomiting

## 2024-11-03 NOTE — Patient Instructions (Signed)
 VISIT SUMMARY:  Today, you were seen for persistent cold symptoms that have lasted for over two weeks. You have been experiencing a sore throat, runny nose, chills, nasal congestion, and a productive cough. Your symptoms initially seemed like a sinus infection, but facial pressure has resolved. You have been taking allergy medicine and Mucinex  without significant improvement. You have a history of allergies and slight ear pain, but no fever, smoking history, or signs of pneumonia.    YOUR PLAN:  -ACUTE SINUSITIS WITH COUGH AND NASAL CONGESTION: Acute bacterial sinusitis is an infection of the sinuses that can cause symptoms like nasal congestion, cough, and phlegm production. It is often caused by bacteria, especially when symptoms persist for an extended period. You have been prescribed doxycycline  100 mg to take twice daily for 10 days to treat the bacterial infection. Additionally, you should take Mucinex  ER 600 mg, two pills twice daily for 7 days to help with mucus. Use Flonase  nasal spray, two sprays in each nostril twice daily for 7 days, then reduce to once daily if your symptoms improve. Make sure to drink plenty of fluids and get plenty of rest. Avoid using systemic steroids.    INSTRUCTIONS:  Please follow up if your symptoms do not improve after completing the prescribed medications or if you experience any worsening of your condition.

## 2024-11-10 ENCOUNTER — Ambulatory Visit: Admit: 2024-11-10 | Admitting: Surgery

## 2024-11-10 ENCOUNTER — Encounter (HOSPITAL_COMMUNITY): Payer: Self-pay

## 2024-11-10 SURGERY — GASTROSCOPY
Anesthesia: General

## 2024-11-24 ENCOUNTER — Other Ambulatory Visit (INDEPENDENT_AMBULATORY_CARE_PROVIDER_SITE_OTHER): Payer: Self-pay | Admitting: NEUROLOGY

## 2024-12-08 ENCOUNTER — Ambulatory Visit (HOSPITAL_COMMUNITY): Admitting: Surgery

## 2024-12-08 ENCOUNTER — Ambulatory Visit (HOSPITAL_COMMUNITY)

## 2024-12-08 ENCOUNTER — Other Ambulatory Visit: Payer: Self-pay

## 2024-12-08 ENCOUNTER — Encounter (HOSPITAL_COMMUNITY)
Admission: RE | Disposition: A | Discharge status: Home / Self Care | Payer: Self-pay | Source: Ambulatory Visit | Attending: Surgery

## 2024-12-08 ENCOUNTER — Encounter (HOSPITAL_COMMUNITY): Payer: Self-pay | Admitting: Surgery

## 2024-12-08 ENCOUNTER — Ambulatory Visit
Admission: RE | Admit: 2024-12-08 | Discharge: 2024-12-08 | Disposition: A | Discharge status: Home / Self Care | Source: Ambulatory Visit | Attending: Surgery | Admitting: Surgery

## 2024-12-08 DIAGNOSIS — K2950 Unspecified chronic gastritis without bleeding: Secondary | ICD-10-CM | POA: Insufficient documentation

## 2024-12-08 DIAGNOSIS — R1013 Epigastric pain: Secondary | ICD-10-CM | POA: Insufficient documentation

## 2024-12-08 DIAGNOSIS — K3189 Other diseases of stomach and duodenum: Secondary | ICD-10-CM | POA: Insufficient documentation

## 2024-12-08 MED ORDER — LIDOCAINE (PF) 100 MG/5 ML (2 %) INTRAVENOUS SYRINGE
INJECTION | Freq: Once | INTRAVENOUS | Status: DC | PRN
  Administered 2024-12-08: 50 mg via INTRAVENOUS

## 2024-12-08 MED ORDER — LACTATED RINGERS INTRAVENOUS SOLUTION
INTRAVENOUS | Status: DC | PRN
  Administered 2024-12-08: 0 via INTRAVENOUS

## 2024-12-08 MED ORDER — PROPOFOL 10 MG/ML IV BOLUS
INJECTION | Freq: Once | INTRAVENOUS | Status: DC | PRN
  Administered 2024-12-08: 150 mg via INTRAVENOUS
  Administered 2024-12-08: 50 mg via INTRAVENOUS

## 2024-12-08 MED ORDER — LIDOCAINE (PF) 20 MG/ML (2 %) INJECTION SOLUTION
INTRAMUSCULAR | Status: AC
  Filled 2024-12-08: qty 5

## 2024-12-08 MED ORDER — PROPOFOL 10 MG/ML INTRAVENOUS EMULSION
INTRAVENOUS | Status: AC
  Filled 2024-12-08: qty 40

## 2024-12-08 NOTE — H&P (Signed)
 [facility]  H&P Update Form    Rissie, Sculley, 79 y.o. female  Encounter Start Date:  12/08/2024  Inpatient Admission Date:   Date of Birth:  Feb 05, 1946    12/08/2024    STOP: IF H&P IS GREATER THAN 30 DAYS FROM SURGICAL DAY COMPLETE NEW H&P IS REQUIRED.     H & P updated the day of the procedure.  1.  H&P completed within 30 days of surgical procedure and has been reviewed within 24 hours of admission but prior to surgery or a procedure requiring anesthesia services by Dr.Fred Sharron MD. the patient has been examined, and no change has occured in the patients condition since the H&P was completed.       Change in medications: No            No LMP recorded. Patient is postmenopausal.      Comments:     2.  Patient continues to be appropiate candidate for planned surgical procedure. YES      Gilmore Sharron, MD

## 2024-12-08 NOTE — Discharge Instructions (Addendum)
 SURGICAL DISCHARGE INSTRUCTIONS     Dr. Sharron Kocher, MD  performed your EGD WITH BIOPSY today at the [facility]    [place]  [facility]:  Monday through Friday from 8 a.m. - 4 p.m.: [phone]    For T&D: [phone]  Between 4 p.m. - 8 a.m., weekends and holidays:  Call ER [phone]    PLEASE SEE WRITTEN HANDOUTS AS DISCUSSED BY YOUR NURSE:  Vernell RN    SIGNS AND SYMPTOMS OF A WOUND / INCISION INFECTION   Be sure to watch for the following:  Increase in redness or red streaks near or around the wound or incision.  Increase in pain that is intense or severe and cannot be relieved by the pain medication that your doctor has given you.  Increase in swelling that cannot be relieved by elevation of a body part, or by applying ice, if permitted.  Increase in drainage, or if yellow / green in color and smells bad. This could be on a dressing or a cast.  Increase in fever for longer than 24 hours, or an increase that is higher than 101 degrees Fahrenheit (normal body temperature is 98 degrees Fahrenheit). The incision may feel warm to the touch.    **CALL YOUR DOCTOR IF ONE OR MORE OF THESE SIGNS / SYMPTOMS SHOULD OCCUR.    ANESTHESIA INFORMATION   ANESTHESIA -- ADULT PATIENTS:  You have received intravenous sedation / general anesthesia, and you may feel drowsy and light-headed for several hours. You may even experience some forgetfulness of the procedure. DO NOT DRIVE A MOTOR VEHICLE or perform any activity requiring complete alertness or coordination until you feel fully awake in about 24-48 hours. Do not drink alcoholic beverages for at least 24 hours. Do not stay alone, you must have a responsible adult available to be with you. You may also experience a dry mouth or nausea for 24 hours. This is a normal side effect and will disappear as the effects of the medication wear off.    REMEMBER   If you experience any difficulty breathing, chest pain, bleeding that you feel is  excessive, persistent nausea or vomiting or for any other concerns:  Call your physician Dr.  Sharron Kocher, MD at [phone]. You may also ask to have the general doctor on call paged. They are available to you 24 hours a day.      SPECIAL INSTRUCTIONS / COMMENTS   No driving today or operating heavy machinery today, resume all normal activities tomorrow.   The findings of your procedure today were: mild antritis and no hiatal hernia.     FOLLOW-UP APPOINTMENTS   Please call your surgeon's office at the number listed to schedule a date / time of return for follow-up.     Dr Tonette Sharron  [phone]

## 2024-12-08 NOTE — Anesthesia Postprocedure Evaluation (Signed)
 Anesthesia Post Op Evaluation    Patient: Kari Gordon  Procedure(s):  EGD WITH BIOPSY    Last Vitals:Temperature: 36.7 C (98 F) (12/08/24 1343)  Heart Rate: (!) 108 (12/08/24 1244)  BP (Non-Invasive): 108/63 (12/08/24 1343)  Respiratory Rate: 16 (12/08/24 1343)  SpO2: 98 % (12/08/24 1343)    No notable events documented.    Patient is sufficiently recovered from the effects of anesthesia to participate in the evaluation and has returned to their pre-procedure level.  Patient location during evaluation: bedside       Patient participation: complete - patient participated  Level of consciousness: awake  Multimodal Pain Management: Multimodal analgesia used between 6 hours prior to anesthesia start to PACU discharge and Medical reason for not using multimodal pain management  Pain score: 0  Pain management: adequate  Airway patency: patent    Anesthetic complications: yes  Cardiovascular status: acceptable  Respiratory status: acceptable  Hydration status: acceptable  Patient post-procedure temperature: Pt Normothermic   PONV Status: Absent

## 2024-12-08 NOTE — Anesthesia Preprocedure Evaluation (Signed)
 ANESTHESIA PRE-OP EVALUATION  Planned Procedure: EGD  Review of Systems                   Pulmonary   sleep apnea,   Cardiovascular    Hypertension and hyperlipidemia ,No peripheral edema,        GI/Hepatic/Renal    GERD        Endo/Other    hypothyroidism and osteoarthritis,      Neuro/Psych/MS    fibromyalgia     Cancer  CA,   breast cancer,                     Physical Assessment      Airway       Mallampati: II    TM distance: <3 FB    Neck ROM: full  Mouth Opening: good.  No Facial hair  No Beard  No endotracheal tube present  No Tracheostomy present    Dental                    Pulmonary    Breath sounds clear to auscultation  (-) no rhonchi, no decreased breath sounds, no wheezes, no rales and no stridor     Cardiovascular    Rhythm: regular  Rate: Normal  (-) no friction rub, carotid bruit is not present, no peripheral edema and no murmur     Other findings              Plan  ASA 2     Planned anesthesia type: general     total intravenous anesthesia

## 2024-12-08 NOTE — OR Surgeon (Signed)
 [facility]    Patient Name: Kari Gordon, Kari Gordon Number: [id number]  Date of Service: 12/08/2024   Date of Birth: [birth year]      Pre-Operative Diagnosis: Epigastric pain [R10.13]     Post-Operative Diagnosis: mild antritis, no hiatal hernia    Procedure(s)/Description:  EGD WITH BIOPSY: 43239 (CPT)       Attending Surgeon: Gilmore Fire, MD     Anesthesia:  CRNA: Melina Pulling, CRNA    Anesthesia Type: .General     Specimen:  Antrum    Patient was taken to the endoscopy suite.  Given appropriate intravenous sedation.  Video gastroscope inserted into the posterior pharynx and directed distally into the esophagus.  Esophagus was transversed and the stomach entered without difficulty.  The stomach was insufflated with air.  Scope was advanced to the level of the pylorus.  Pylorus was cannulated.  The first and second portion of the duodenum visualized without evidence of any abnormality.  Scope was withdrawn back into the distal stomach.  Single biopsy was taken for H. pylori at this level.  There were mild changes of superficial gastritis noted within the antrum.  The scope was withdrawn back.  The body and the fundus showed no specific abnormality.  The scope was retroflexed.  The gastroesophageal junction visualized from below.  No hiatal hernia was noted and no other specific abnormality.  Scope was then withdrawn back to the main course of the esophageal lumen.  There is no significant irregularity at the level of the squamocolumnar junction.  No signs of esophagitis.  There is no luminal constriction, or any other abnormality noted upon withdrawing back through the esophageal lumen.  Scope was then withdrawn, and this concluded the procedure patient tolerated well.    FW Fire MD FACS

## 2024-12-08 NOTE — Anesthesia Transfer of Care (Signed)
 ANESTHESIA TRANSFER OF CARE   Kari Gordon is a 79 y.o. ,female, Weight: 68 kg (150 lb)   had Procedure(s):  EGD WITH BIOPSY  performed  12/08/2024   Primary Service: Gilmore Fire, MD    Past Medical History:   Diagnosis Date    Alopecia 03/22/2022    Cancer (CMS HCC)     Diabetes mellitus, type 2     Dysuria 03/22/2022    Esophageal reflux     Essential hypertension     Fibromyalgia     Herpes zoster 03/22/2022    Hx of breast cancer     Insomnia 03/22/2022    Nerve damage     Osteoarthritis     Sleep apnea     Type 2 diabetes, diet controlled (CMS HCC) 03/22/2022    Unspecified glaucoma(365.9)     Uterine cancer     UTI (urinary tract infection) 03/22/2022      Allergy History as of 12/08/24       HYDROCODONE-ACETAMINOPHEN         Noted Status Severity Type Reaction    11/10/21 0953 Teresa Pulling, MA 06/15/08 Active High  Itching, Nausea/ Vomiting    Comments: headache  headache                 SULFA (SULFONAMIDES)         Noted Status Severity Type Reaction    11/10/21 0953 Teresa Pulling, MA 02/03/21 Active Medium  Rash              ERYTHROMYCIN         Noted Status Severity Type Reaction    11/10/21 0953 Teresa Pulling, MA 06/15/08 Active Low  Nausea/ Vomiting              DULOXETINE         Noted Status Severity Type Reaction    03/22/22 0717 Lum Knee, MA 03/22/22 Active   Mental Status Effect              ERYTHROMYCIN LACTOBIONATE         Noted Status Severity Type Reaction    07/08/24 9062 Rumalda Heron BRAVO, LPN 95/92/74 Active                     I completed my transfer of care / handoff to the receiving personnel during which we discussed:  Access, Airway, All key/critical aspects of case discussed and Analgesia      Post Location: Phase II                                                             Last OR Temp: Temperature: 36.7 C (98 F)      Airway:* No LDAs found *  Blood pressure 108/63, pulse (!) 108, temperature 36.7 C (98 F), resp. rate 16, height 1.676 m (5' 6), weight 68 kg (150 lb),  SpO2 98%.

## 2024-12-09 DIAGNOSIS — R1013 Epigastric pain: Secondary | ICD-10-CM

## 2024-12-09 LAB — SURGICAL PATHOLOGY SPECIMEN

## 2024-12-10 ENCOUNTER — Ambulatory Visit (RURAL_HEALTH_CENTER): Attending: Family Medicine

## 2024-12-10 ENCOUNTER — Ambulatory Visit: Attending: Family Medicine | Admitting: Family Medicine

## 2024-12-10 ENCOUNTER — Other Ambulatory Visit: Payer: Self-pay

## 2024-12-10 DIAGNOSIS — E785 Hyperlipidemia, unspecified: Secondary | ICD-10-CM | POA: Insufficient documentation

## 2024-12-10 DIAGNOSIS — N182 Chronic kidney disease, stage 2 (mild): Secondary | ICD-10-CM | POA: Insufficient documentation

## 2024-12-10 DIAGNOSIS — E1142 Type 2 diabetes mellitus with diabetic polyneuropathy: Secondary | ICD-10-CM | POA: Insufficient documentation

## 2024-12-10 DIAGNOSIS — K219 Gastro-esophageal reflux disease without esophagitis: Secondary | ICD-10-CM | POA: Insufficient documentation

## 2024-12-10 DIAGNOSIS — I10 Essential (primary) hypertension: Secondary | ICD-10-CM | POA: Insufficient documentation

## 2024-12-10 DIAGNOSIS — E119 Type 2 diabetes mellitus without complications: Secondary | ICD-10-CM | POA: Insufficient documentation

## 2024-12-10 DIAGNOSIS — E559 Vitamin D deficiency, unspecified: Secondary | ICD-10-CM | POA: Insufficient documentation

## 2024-12-10 LAB — CBC WITH DIFF
BASOPHIL #: 0 x10ˆ3/uL (ref 0.00–0.10)
BASOPHIL %: 0 % (ref 0–1)
EOSINOPHIL #: 0.1 x10ˆ3/uL (ref 0.00–0.50)
EOSINOPHIL %: 2 % (ref 1–7)
HCT: 37.9 % (ref 31.2–41.9)
LYMPHOCYTE %: 18 % (ref 16–46)
MCHC: 35.5 g/dL (ref 32.3–35.6)
MCV: 94.7 fL (ref 75.5–95.3)
MONOCYTE #: 0.7 x10ˆ3/uL (ref 0.20–0.90)
MONOCYTE %: 13 % — ABNORMAL HIGH (ref 4–11)
MPV: 8.3 fL (ref 7.9–10.8)
NEUTROPHIL #: 3.6 x10ˆ3/uL (ref 1.90–8.20)
NEUTROPHIL %: 67 % (ref 43–77)
PLATELETS: 159 x10ˆ3/uL (ref 140–440)
RDW: 13.2 % (ref 12.3–17.7)
WBC: 5.3 x10ˆ3/uL (ref 3.8–11.8)

## 2024-12-10 LAB — COMPREHENSIVE METABOLIC PANEL, NON-FASTING
ALBUMIN/GLOBULIN RATIO: 1.6 — ABNORMAL HIGH (ref 0.8–1.4)
ALBUMIN: 4.3 g/dL (ref 3.5–5.7)
ALKALINE PHOSPHATASE: 47 U/L (ref 34–104)
ALT (SGPT): 11 U/L (ref 7–52)
AST (SGOT): 17 U/L (ref 13–39)
BUN/CREA RATIO: 16 (ref 6–22)
CALCIUM, CORRECTED: 9.5 mg/dL (ref 8.9–10.8)
CALCIUM: 9.7 mg/dL (ref 8.6–10.3)
CHLORIDE: 104 mmol/L (ref 98–107)
CO2 TOTAL: 25 mmol/L (ref 21–31)
CREATININE: 0.88 mg/dL (ref 0.60–1.30)
ESTIMATED GFR: 68 mL/min/1.73mˆ2 (ref >59)
GLUCOSE: 137 mg/dL — ABNORMAL HIGH (ref 74–109)
OSMOLALITY, CALCULATED: 275 mosm/kg (ref 270–290)
POTASSIUM: 3.8 mmol/L (ref 3.5–5.1)

## 2024-12-10 LAB — LIPID PANEL: CHOL/HDL RATIO: 3.1

## 2024-12-10 LAB — VITAMIN B12: VITAMIN B 12: 234 pg/mL (ref 180–914)

## 2024-12-10 LAB — MAGNESIUM: MAGNESIUM: 2.1 mg/dL (ref 1.9–2.7)

## 2024-12-10 LAB — HGA1C (HEMOGLOBIN A1C WITH EST AVG GLUCOSE): HEMOGLOBIN A1C: 6 % (ref 4.0–6.0)

## 2024-12-10 LAB — THYROID STIMULATING HORMONE (SENSITIVE TSH): TSH: 3.571 u[IU]/mL (ref 0.450–5.330)

## 2024-12-18 ENCOUNTER — Encounter (RURAL_HEALTH_CENTER): Payer: Self-pay | Admitting: Family Medicine

## 2024-12-18 ENCOUNTER — Ambulatory Visit (RURAL_HEALTH_CENTER): Payer: Self-pay | Attending: Family Medicine | Admitting: Family Medicine

## 2024-12-18 ENCOUNTER — Other Ambulatory Visit: Payer: Self-pay

## 2024-12-18 VITALS — BP 126/70 | HR 90 | Temp 98.6°F | Ht 65.0 in | Wt 148.0 lb

## 2024-12-18 DIAGNOSIS — C50412 Malignant neoplasm of upper-outer quadrant of left female breast: Secondary | ICD-10-CM | POA: Insufficient documentation

## 2024-12-18 DIAGNOSIS — E119 Type 2 diabetes mellitus without complications: Secondary | ICD-10-CM | POA: Insufficient documentation

## 2024-12-18 DIAGNOSIS — N183 Chronic kidney disease, stage 3 unspecified: Secondary | ICD-10-CM | POA: Insufficient documentation

## 2024-12-18 DIAGNOSIS — G43909 Migraine, unspecified, not intractable, without status migrainosus: Secondary | ICD-10-CM | POA: Insufficient documentation

## 2024-12-18 DIAGNOSIS — E1142 Type 2 diabetes mellitus with diabetic polyneuropathy: Secondary | ICD-10-CM | POA: Insufficient documentation

## 2024-12-18 DIAGNOSIS — Z170 Estrogen receptor positive status [ER+]: Secondary | ICD-10-CM | POA: Insufficient documentation

## 2024-12-18 DIAGNOSIS — E785 Hyperlipidemia, unspecified: Secondary | ICD-10-CM | POA: Insufficient documentation

## 2024-12-18 DIAGNOSIS — Z79899 Other long term (current) drug therapy: Secondary | ICD-10-CM | POA: Insufficient documentation

## 2024-12-18 DIAGNOSIS — L900 Lichen sclerosus et atrophicus: Secondary | ICD-10-CM | POA: Insufficient documentation

## 2024-12-18 DIAGNOSIS — G4700 Insomnia, unspecified: Secondary | ICD-10-CM | POA: Insufficient documentation

## 2024-12-18 DIAGNOSIS — E538 Deficiency of other specified B group vitamins: Secondary | ICD-10-CM | POA: Insufficient documentation

## 2024-12-18 DIAGNOSIS — N182 Chronic kidney disease, stage 2 (mild): Secondary | ICD-10-CM | POA: Insufficient documentation

## 2024-12-18 DIAGNOSIS — E1122 Type 2 diabetes mellitus with diabetic chronic kidney disease: Secondary | ICD-10-CM | POA: Insufficient documentation

## 2024-12-18 DIAGNOSIS — K219 Gastro-esophageal reflux disease without esophagitis: Secondary | ICD-10-CM | POA: Insufficient documentation

## 2024-12-18 MED ORDER — FAMOTIDINE 40 MG TABLET: 40.0000 mg | ORAL_TABLET | Freq: Two times a day (BID) | ORAL | 3 refills | Status: AC

## 2024-12-18 MED ORDER — ZOLPIDEM 10 MG TABLET: 10.0000 mg | ORAL_TABLET | Freq: Every evening | ORAL | 1 refills | Status: AC | PRN

## 2024-12-18 MED ORDER — ATORVASTATIN 40 MG TABLET: 40.0000 mg | ORAL_TABLET | Freq: Every evening | ORAL | 3 refills | Status: AC

## 2024-12-18 MED ORDER — AMLODIPINE 5 MG-VALSARTAN 320 MG TABLET: 1.0000 | ORAL_TABLET | Freq: Every day | ORAL | 3 refills | Status: AC

## 2024-12-18 MED ORDER — LEVOTHYROXINE 25 MCG TABLET: 25.0000 ug | ORAL_TABLET | Freq: Every morning | ORAL | 3 refills | Status: AC

## 2024-12-18 MED ORDER — AMOXICILLIN 875 MG-POTASSIUM CLAVULANATE 125 MG TABLET: 1.0000 | ORAL_TABLET | Freq: Two times a day (BID) | ORAL | 0 refills | Status: AC

## 2024-12-18 MED ORDER — CLOBETASOL 0.05 % TOPICAL GEL: Freq: Every day | CUTANEOUS | 11 refills | Status: AC

## 2024-12-18 MED ORDER — FLUCONAZOLE 150 MG TABLET: 150.0000 mg | ORAL_TABLET | ORAL | 1 refills | Status: AC | PRN

## 2024-12-18 NOTE — Progress Notes (Signed)
 FAMILY MED[facility], [facility]  [address]  [place] [address]  Operated by 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) - Primary    Relevant Medications    atorvastatin  (LIPITOR) 40 mg Oral Tablet       Nephrology    RESOLVED: CKD (chronic kidney disease) stage 3, GFR 30-59 ml/min    Relevant Medications    atorvastatin  (LIPITOR) 40 mg Oral Tablet    Amlodipine -Valsartan  (EXFORGE ) 5-320 mg Oral Tablet       Endocrine    Type 2 diabetes, diet controlled (CMS HCC)    Relevant Medications    atorvastatin  (LIPITOR) 40 mg Oral Tablet       Dermatology    Lichen sclerosus et atrophicus     Other Visit Diagnoses         Gastroesophageal reflux disease, unspecified whether esophagitis present        Relevant Medications    famotidine  (PEPCID ) 40 mg Oral Tablet      Migraine          Malignant neoplasm of upper-outer quadrant of left breast in female, estrogen receptor positive (CMS HCC)          B12 deficiency                     Results for orders placed or performed in visit on 12/10/24 (from the past 4 weeks)   COMPREHENSIVE METABOLIC PANEL, NON-FASTING    Collection Time: 12/10/24  8:31 AM   Result Value Ref Range    SODIUM 136 136 - 145 mmol/L    POTASSIUM 3.8 3.5 - 5.1 mmol/L    CHLORIDE 104 98 - 107 mmol/L    CO2 TOTAL 25 21 - 31 mmol/L    ANION GAP 7 4 - 13 mmol/L    BUN 14 7 - 25 mg/dL    CREATININE 9.11 9.39 - 1.30 mg/dL    BUN/CREA RATIO 16 6 - 22    ESTIMATED GFR  now improved to stage 2 CKD. 68 >59 mL/min/1.54m2    ALBUMIN 4.3 3.5 - 5.7 g/dL    CALCIUM 9.7 8.6 - 89.6 mg/dL    GLUCOSE 862 (H)  74 - 109 mg/dL    ALKALINE PHOSPHATASE 47 34 - 104 U/L    ALT (SGPT) 11 7 - 52 U/L    AST (SGOT) 17 13 - 39 U/L    BILIRUBIN TOTAL 0.6 0.3 - 1.0 mg/dL    PROTEIN TOTAL 7.0 6.4 - 8.9 g/dL    ALBUMIN/GLOBULIN RATIO 1.6 (H) 0.8 - 1.4    OSMOLALITY, CALCULATED 275 270 - 290 mOsm/kg    CALCIUM, CORRECTED 9.5 8.9 - 10.8 mg/dL    GLOBULIN 2.7 2.0 - 3.5   HGA1C (HEMOGLOBIN A1C WITH EST AVG GLUCOSE)    Collection Time: 12/10/24  8:31 AM   Result Value Ref Range  HEMOGLOBIN A1C  Good overall glucose control 6.0 4.0 - 6.0 %   LIPID PANEL    Collection Time: 12/10/24  8:31 AM   Result Value Ref Range    CHOLESTEROL 132 <200 mg/dL    TRIGLYCERIDES 864 <=849 mg/dL    HDL CHOL 43 >=59 mg/dL    LDL CALC  good cholesterol control 62 0 - 100 mg/dL    VLDL CALC 27 0 - 50 mg/dL    CHOL/HDL RATIO 3.1    MICROALBUMIN/CREATININE RATIO, URINE, RANDOM    Collection Time: 12/10/24  8:31 AM   Result Value Ref Range    CREATININE RANDOM URINE 21 (L) 30 - 125 mg/dL    MICROALBUMIN RANDOM URINE <0.7 mg/dL    MICROALBUMIN/CREATININE RATIO RANDOM URINE     THYROID  STIMULATING HORMONE (SENSITIVE TSH)    Collection Time: 12/10/24  8:31 AM   Result Value Ref Range    TSH 3.571 0.450 - 5.330 uIU/mL   MAGNESIUM    Collection Time: 12/10/24  8:31 AM   Result Value Ref Range    MAGNESIUM 2.1 1.9 - 2.7 mg/dL   URIC ACID    Collection Time: 12/10/24  8:31 AM   Result Value Ref Range    URIC ACID 3.2 2.3 - 7.6 mg/dL   VITAMIN A87    Collection Time: 12/10/24  8:31 AM   Result Value Ref Range    VITAMIN B 12 234 180 - 914 pg/mL   VITAMIN D  25 TOTAL    Collection Time: 12/10/24  8:31 AM   Result Value Ref Range    VITAMIN D  25, TOTAL 43.33 30.00 - 100.00 ng/mL   CBC WITH DIFF    Collection Time: 12/10/24  8:31 AM   Result Value Ref Range    WBC 5.3 3.8 - 11.8 x103/uL    RBC 4.00 3.63 - 4.92 x106/uL    HGB 13.4 10.9 - 14.3 g/dL    HCT 62.0 68.7 - 58.0 %    MCV 94.7 75.5 - 95.3 fL    MCH 33.6 (H) 24.7 - 32.8 pg    MCHC 35.5 32.3 - 35.6 g/dL    RDW 86.7 87.6  - 82.2 %    PLATELETS 159 140 - 440 x103/uL    MPV 8.3 7.9 - 10.8 fL    NEUTROPHIL % 67 43 - 77 %    LYMPHOCYTE % 18 16 - 46 %    MONOCYTE % 13 (H) 4 - 11 %    EOSINOPHIL % 2 1 - 7 %    BASOPHIL % 0 0 - 1 %    NEUTROPHIL # 3.60 1.90 - 8.20 x103/uL    LYMPHOCYTE # 0.90 (L) 1.10 - 3.10 x103/uL    MONOCYTE # 0.70 0.20 - 0.90 x103/uL    EOSINOPHIL # 0.10 0.00 - 0.50 x103/uL    BASOPHIL # 0.00 0.00 - 0.10 x103/uL   Results for orders placed or performed during the hospital encounter of 12/08/24 (from the past 4 weeks)   SURGICAL PATHOLOGY SPECIMEN    Collection Time: 12/08/24  1:29 PM   Result Value Ref Range    Final Diagnosis       Stomach, antrum, biopsy:    Antral type gastric mucosa showing reactive gastropathy with mildly increased chronic inflammation.  No organisms morphologically compatible with Helicobacter species are seen on immunostain.  Controls stain appropriately.  No intestinal metaplasia, dysplasia or malignancy identified.      Clinical History  Procedure: EGD WITH BIOPSY   Provider: Sharron Kocher, MD (Primary)   Pre-op Diagnosis: Epigastric pain [R10.13]   Post-op Diagnosis: mild antritis, no hiatal hernia         Gross Description       A: Antrum  The specimen is received in formalin labeled with the patient's name and gastric antrum biopsy and consists of 1 soft tan piece of tissue measuring 3 mm in greatest dimension, 1 cassette.          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 Kidney function has improved with an estimated filtration rate of 68. Jardiance  was held due to lichen sclerosus flare and potential risk of necrotizing fasciitis. Discussed the risk of necrotizing fasciitis with Jardiance , especially in the context of lichen sclerosus flares.  - Held Jardiance  for three months and will recheck labs in three months.  - Will consider resuming Jardiance  if kidney function declines or glucose  control worsens.  - Will consider alternative medications like Kerendia if needed.    Malignant neoplasm of left breast, estrogen receptor positive  Currently on Anastrozole  since 2017. Discussed standard endocrine therapy duration and potential need to reassess long-term use. Discussed the standard therapy duration of five years and potential extension based on risk factors.  - Discuss with oncology team about the duration of Anastrozole  therapy.    Lichen sclerosus et atrophicus  Flares occur three to four times a year, causing soreness and tenderness. Jardiance  was held due to flare. Discussed potential use of estrogen and steroids, noting risks of skin thinning with steroids.  - Held Jardiance  during flares.  - Will consider alternative treatments for lichen sclerosus.    Gastroesophageal reflux disease  Chronic inflammation noted on EGD, no esophagitis or H. pylori. Symptoms include acid reflux and coughing. Discussed lifestyle modifications and potential use of proton pump inhibitors. Discussed the use of a bed wedge to elevate the head of the bed to reduce reflux symptoms.  - Use Flonase  nasal spray for sinus-related symptoms.  - Consider bed wedge to elevate head of bed.  - Avoid eating three hours before bedtime.  - Use Prilosec as needed for symptom control in addition to Pepcid . While PPI's are hard on the kidneys, it is ok to use them and avoid significant GI distress    Migraine  Headaches have improved with current treatment. Discussed potential increase in dosage of current medication. Considered alternative medication Qulipta if current treatment becomes less effective.  Managed by neurology. Fu there    Insomnia  Currently using Ambien  for sleep. Discussed potential reduction in dosage if effective. Reports average of six to seven hours of sleep on good nights.  - Continue Ambien  as needed for sleep. Tried cutting back on the dose and it did not help    Vitamin B12 deficiency  B12 levels are low.  Discussed over-the-counter B12 supplementation as an alternative to injections.  She wants Try over-the-counter B12 supplementation.  - Will recheck B12 levels if no improvement.    General health maintenance  Flu shot received in early September. Discussed general health maintenance and lifestyle modifications.  - Continue routine health maintenance.    Recording duration: 32 minutes     Chief Complaint: Follow Up (routine)    Subjective:  Daleysa A Riedlinger is a 79 y.o. female with GERD, hyperlipidemia, hypertension, migraine headaches, osteoarthritis, sleep apnea, lichen sclerosis, and vitamin D  deficiency  who presents for a follow up.     Had flu shot at the health center    Has had sinus issues since July. Has watery, itchy eyes    Objective:  BP 136/76 (Site: Left Arm, Patient Position: Sitting, Cuff Size: Adult)   Pulse 94   Temp 37.2 C (98.9 F) (Tympanic)   Ht 1.676 m (5' 6)   Wt 67.6 kg (149 lb)   SpO2 98%   BMI 24.05 kg/m       Physical Exam   Physical Exam  VITALS: BP- 126/70  GENERAL: Alert, cooperative, well developed, no acute distress.  HEENT: Normocephalic, normal oropharynx, nares edematous and erythematous.  CHEST: Clear to auscultation bilaterally, no wheezes, rhonchi, or crackles.  CARDIOVASCULAR: Normal heart rate and rhythm, S1 and S2 normal without murmurs.  ABDOMEN: Soft, non-tender, non-distended, without organomegaly, normal bowel sounds.  EXTREMITIES: No cyanosis or edema.  NEUROLOGICAL: Cranial nerves grossly intact, moves all extremities without gross motor or sensory deficit.          Data reviewed:      Current Outpatient Medications   Medication Sig    Amlodipine -Valsartan  (EXFORGE ) 5-320 mg Oral Tablet Take 1 Tablet by mouth Daily Indications: high blood pressure    anastrozole  (ARIMIDEX ) 1 mg Oral Tablet Take 1 Tablet (1 mg total) by mouth Once a day    aspirin (ECOTRIN) 81 mg Oral Tablet, Delayed Release (E.C.) Take 1 Tablet (81 mg total) by mouth    atorvastatin  (LIPITOR)  40 mg Oral Tablet Take 1 Tablet (40 mg total) by mouth Every evening    cholecalciferol, vitamin D3, 25 mcg (1,000 unit) Oral Tablet Take 1 Tablet (1,000 Units total) by mouth Daily    Clobetasol  0.05 % Gel Insert into the vagina Once a day    Coenzyme Q10 (CO Q-10) 10 mg Oral Capsule Take by mouth Twice daily    cyclobenzaprine  (FLEXERIL ) 5 mg Oral Tablet Take 1 Tablet (5 mg total) by mouth Every 8 hours as needed for Muscle spasms    famotidine  (PEPCID ) 40 mg Oral Tablet Take 1 Tablet (40 mg total) by mouth Twice daily    fluconazole  (DIFLUCAN ) 150 mg Oral Tablet Take 1 Tablet (150 mg total) by mouth Every 72 hours as needed    fremanezumab -vfrm (AJOVY  AUTOINJECTOR) 225 mg/1.5 mL Subcutaneous Auto-Injector Inject 1.5 mL (225 mg total) under the skin Every 30 days for 180 days    ketoconazole (NIZORAL) 2 % Cream Apply topically    latanoprost (XALATAN) 0.005 % Ophthalmic Drops     levothyroxine  (SYNTHROID ) 25 mcg Oral Tablet Take 1 Tablet (25 mcg total) by mouth Every morning    omega-3/dha/epa/fish oil (OMEGA-3 FISH OIL ORAL) Take 1 Tablet by mouth Once a day For joints  omega XL (Patient taking differently: Take 2 Tablets by mouth Twice daily For joints  omega XL)    omeprazole  (PRILOSEC) 20 mg Oral Capsule, Delayed Release(E.C.) Take 1 Capsule (20 mg total) by mouth Once a day    prednisoLONE acetate (PRED FORTE) 1 % Ophthalmic Drops, Suspension     Resorcinol-Petro-Cala-Zinc-Lan (RESINOL) 2-55 % Ointment Apply topically Three times a day    sennosides-docusate sodium  (SENOKOT-S) 8.6-50 mg Oral Tablet Take 2 Tablets by mouth Every night    sucralfate  (CARAFATE ) 1 gram Oral Tablet Take 1 Tablet (1 g total) by mouth Four times a day - before meals and bedtime    ubrogepant  (UBRELVY ) 100 mg Oral Tablet Take 1 Tablet (100 mg total) by mouth  Every 24 hours as needed (for migraine) for up to 180 days    zolpidem  (AMBIEN ) 10 mg Oral Tablet Take 1 Tablet (10 mg total) by mouth Every night as needed for Insomnia        Jhonny Romano, DO     This note was created with assistance from [employer] via capture of conversational audio. Consent was obtained from the patient and all parties present prior to recording.

## 2025-01-27 ENCOUNTER — Encounter (INDEPENDENT_AMBULATORY_CARE_PROVIDER_SITE_OTHER): Admitting: NEUROLOGY

## 2025-03-19 ENCOUNTER — Ambulatory Visit (RURAL_HEALTH_CENTER): Payer: Self-pay | Admitting: Family Medicine

## 2025-06-17 ENCOUNTER — Ambulatory Visit (RURAL_HEALTH_CENTER): Payer: Self-pay | Admitting: Family Medicine

## 2025-07-13 ENCOUNTER — Ambulatory Visit (RURAL_HEALTH_CENTER): Admitting: Family Medicine

## 2025-10-13 ENCOUNTER — Ambulatory Visit (INDEPENDENT_AMBULATORY_CARE_PROVIDER_SITE_OTHER): Payer: Self-pay | Admitting: NURSE PRACTITIONER
